# Patient Record
Sex: Female | Born: 1985 | Race: Black or African American | Hispanic: No | Marital: Single | Smoking: Never smoker
Health system: Southern US, Community
[De-identification: ages and names within clinical notes are randomized; demographics above are authoritative.]

## PROBLEM LIST (undated history)

## (undated) DIAGNOSIS — E119 Type 2 diabetes mellitus without complications: Secondary | ICD-10-CM

## (undated) DIAGNOSIS — G43909 Migraine, unspecified, not intractable, without status migrainosus: Secondary | ICD-10-CM

## (undated) HISTORY — DX: Migraine, unspecified, not intractable, without status migrainosus: G43.909

---
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
## 2008-03-05 ENCOUNTER — Ambulatory Visit: Payer: Self-pay | Admitting: *Deleted

## 2008-03-05 DIAGNOSIS — R030 Elevated blood-pressure reading, without diagnosis of hypertension: Secondary | ICD-10-CM | POA: Insufficient documentation

## 2008-03-05 DIAGNOSIS — L708 Other acne: Secondary | ICD-10-CM | POA: Insufficient documentation

## 2008-03-05 DIAGNOSIS — K219 Gastro-esophageal reflux disease without esophagitis: Secondary | ICD-10-CM

## 2008-03-05 DIAGNOSIS — G43009 Migraine without aura, not intractable, without status migrainosus: Secondary | ICD-10-CM

## 2008-03-05 DIAGNOSIS — R079 Chest pain, unspecified: Secondary | ICD-10-CM

## 2008-03-17 ENCOUNTER — Ambulatory Visit: Payer: Self-pay | Admitting: *Deleted

## 2008-03-17 DIAGNOSIS — J069 Acute upper respiratory infection, unspecified: Secondary | ICD-10-CM | POA: Insufficient documentation

## 2008-03-26 ENCOUNTER — Ambulatory Visit: Payer: Self-pay | Admitting: *Deleted

## 2008-03-26 DIAGNOSIS — E669 Obesity, unspecified: Secondary | ICD-10-CM | POA: Insufficient documentation

## 2008-03-26 LAB — CONVERTED CEMR LAB
AST: 22 units/L (ref 0–37)
Albumin: 4 g/dL (ref 3.5–5.2)
BUN: 9 mg/dL (ref 6–23)
CO2: 30 meq/L (ref 19–32)
Calcium: 9.5 mg/dL (ref 8.4–10.5)
Chloride: 103 meq/L (ref 96–112)
Eosinophils Absolute: 0.1 10*3/uL (ref 0.0–0.7)
Eosinophils Relative: 2 % (ref 0.0–5.0)
GFR calc Af Amer: 114 mL/min
Glucose, Bld: 113 mg/dL — ABNORMAL HIGH (ref 70–99)
HDL: 41.2 mg/dL (ref >39.0)
LDL Cholesterol: 133 mg/dL — ABNORMAL HIGH (ref 0–99)
Lymphocytes Relative: 32.9 % (ref 12.0–46.0)
Monocytes Relative: 6.1 % (ref 3.0–12.0)
Neutrophils Relative %: 58.7 % (ref 43.0–77.0)
Platelets: 304 10*3/uL (ref 150–400)
Potassium: 4.1 meq/L (ref 3.5–5.1)
RBC: 4.34 M/uL (ref 3.87–5.11)
TSH: 1.2 microintl units/mL (ref 0.35–5.50)
Total CHOL/HDL Ratio: 4.8
VLDL: 21 mg/dL (ref 0–40)
WBC: 6.3 10*3/uL (ref 4.5–10.5)

## 2008-03-27 DIAGNOSIS — E739 Lactose intolerance, unspecified: Secondary | ICD-10-CM | POA: Insufficient documentation

## 2008-03-27 DIAGNOSIS — E7800 Pure hypercholesterolemia, unspecified: Secondary | ICD-10-CM

## 2008-03-27 DIAGNOSIS — D649 Anemia, unspecified: Secondary | ICD-10-CM

## 2008-05-07 ENCOUNTER — Ambulatory Visit: Payer: Self-pay | Admitting: *Deleted

## 2009-06-02 ENCOUNTER — Ambulatory Visit: Payer: Self-pay | Admitting: Family

## 2009-06-02 DIAGNOSIS — G4700 Insomnia, unspecified: Secondary | ICD-10-CM | POA: Insufficient documentation

## 2010-04-20 NOTE — Assessment & Plan Note (Signed)
Summary: ? Elevated BP, new to est, Dr. Andrey Campanile Pt.- jr   Vital Signs:  Patient profile:   25 year old female Weight:      231 pounds BMI:     32.33 O2 Sat:      98 % on Room air Temp:     98.5 degrees F oral Pulse rate:   94 / minute Pulse rhythm:   regular Resp:     12 per minute BP sitting:   128 / 70  (right arm) Cuff size:   large  Vitals Entered By: Mervin Kung CMA (June 02, 2009 3:14 PM)  O2 Flow:  Room air CC: room 4  Unable to sleep x 2 nights and now having headache., Headache   Primary Care Provider:  Paulo Fruit MD  CC:  room 4  Unable to sleep x 2 nights and now having headache. and Headache.  History of Present Illness: Dawn Shields is a 25 year old female who presents with c/o difficulty sleeping.  Notes that she had headach yesterday which was localized above the left eye.  HA was throbbing in nature- took excedrin which normally helps her, but this did not work per patient.   HA is still present at this time, but has eased off, feels more like a pressure.  Denies associated photophobia, or phonophobia.  These symptoms were similar to her previous migraines, only this headache was worse.  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    Allergies (verified): No Known Drug Allergies  Physical Exam  General:  Well-developed,well-nourished,in no acute distress; alert,appropriate and cooperative throughout examination Lungs:  Normal respiratory effort, chest expands symmetrically. Lungs are clear to auscultation, no crackles or wheezes. Heart:  Normal rate and regular rhythm. S1 and S2 normal without gallop, murmur, click, rub or other extra sounds. Neurologic:  EOM intact strength normal in all extremities and DTRs symmetrical and normal.   Psych:  Cognition and judgment appear intact. Alert and cooperative with normal attention span and concentration. No apparent delusions, illusions, hallucinations   Impression  & Recommendations:  Problem # 1:  COMMON MIGRAINE (ICD-346.10) Assessment Improved Symptoms improved.  Patient was give script for Imitrex to use as needed.   Her updated medication list for this problem includes:    Excedrin Migraine 250-250-65 Mg Tabs (Aspirin-acetaminophen-caffeine) ..... Over the counter as needed    Imitrex 50 Mg Tabs (Sumatriptan succinate) ..... One tablet by mouth as need for migraine, may repeat in 2 hours as needed  Problem # 2:  INSOMNIA, MILD (ICD-780.52) Assessment: Comment Only Notes that generally when she has difficulty sleeping, she has had success with Excedrin PM.  I recommended that she use Benadryl as needed.    Complete Medication List: 1)  Ery-tab 333 Mg Tbec (Erythromycin base) .... One tablet twice daily 2)  Excedrin Migraine 250-250-65 Mg Tabs (Aspirin-acetaminophen-caffeine) .... Over the counter as needed 3)  Imitrex 50 Mg Tabs (Sumatriptan succinate) .... One tablet by mouth as need for migraine, may repeat in 2 hours as needed 4)  Benadryl 25 Mg Tabs (Diphenhydramine hcl) .... One to two tablets by mouth hs as needed insomnia  Patient Instructions: 1)  Please call if you experience recurrent headaches which are not relieved by imitrex, if severe HA go to ER. 2)  Please arrange a follow up appointment for a complete physical. Prescriptions: IMITREX 50 MG TABS (SUMATRIPTAN SUCCINATE) one tablet by mouth  as need for migraine, may repeat in 2 hours as needed  #6 x 0   Entered and Authorized by:   Lemont Fillers FNP   Signed by:   Lemont Fillers FNP on 06/02/2009   Method used:   Electronically to        [facility] [phone][facility] (retail)       [address] [place], [place]  [address]       Ph: [phone]       Fax: [phone]   RxID:   [id number]   Current Allergies (reviewed today): No known allergies

## 2010-06-01 ENCOUNTER — Ambulatory Visit: Payer: Self-pay | Admitting: Internal Medicine

## 2010-12-02 ENCOUNTER — Emergency Department (HOSPITAL_BASED_OUTPATIENT_CLINIC_OR_DEPARTMENT_OTHER)
Admission: EM | Admit: 2010-12-02 | Discharge: 2010-12-02 | Disposition: A | Discharge status: Home / Self Care | Payer: No Typology Code available for payment source | Attending: Emergency Medicine | Admitting: Emergency Medicine

## 2010-12-02 ENCOUNTER — Encounter: Payer: Self-pay | Admitting: *Deleted

## 2010-12-02 DIAGNOSIS — M545 Low back pain, unspecified: Secondary | ICD-10-CM | POA: Insufficient documentation

## 2010-12-02 DIAGNOSIS — Y92410 Unspecified street and highway as the place of occurrence of the external cause: Secondary | ICD-10-CM | POA: Insufficient documentation

## 2010-12-02 DIAGNOSIS — M791 Myalgia, unspecified site: Secondary | ICD-10-CM

## 2010-12-02 DIAGNOSIS — M542 Cervicalgia: Secondary | ICD-10-CM | POA: Insufficient documentation

## 2010-12-02 MED ORDER — HYDROCODONE-ACETAMINOPHEN 5-325 MG PO TABS: 2.0000 | ORAL_TABLET | ORAL | Status: AC | PRN

## 2010-12-02 MED ORDER — IBUPROFEN 800 MG PO TABS: 800.0000 mg | ORAL_TABLET | Freq: Three times a day (TID) | ORAL | Status: AC
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
## 2010-12-06 NOTE — ED Provider Notes (Signed)
Medical screening examination/treatment/procedure(s) were performed by non-physician practitioner and as supervising physician I was immediately available for consultation/collaboration.   Nat Christen, MD 12/06/10 [phone]

## 2011-12-05 ENCOUNTER — Emergency Department (HOSPITAL_BASED_OUTPATIENT_CLINIC_OR_DEPARTMENT_OTHER)
Admission: EM | Admit: 2011-12-05 | Discharge: 2011-12-05 | Disposition: A | Discharge status: Home / Self Care | Payer: 59 | Attending: Emergency Medicine | Admitting: Emergency Medicine

## 2011-12-05 ENCOUNTER — Encounter (HOSPITAL_BASED_OUTPATIENT_CLINIC_OR_DEPARTMENT_OTHER): Payer: Self-pay | Admitting: *Deleted

## 2011-12-05 DIAGNOSIS — M25569 Pain in unspecified knee: Secondary | ICD-10-CM | POA: Insufficient documentation

## 2011-12-05 DIAGNOSIS — M222X1 Patellofemoral disorders, right knee: Secondary | ICD-10-CM

## 2011-12-05 MED ORDER — HYDROCODONE-ACETAMINOPHEN 5-325 MG PO TABS: 2.0000 | ORAL_TABLET | Freq: Three times a day (TID) | ORAL | Status: DC | PRN

## 2011-12-05 NOTE — ED Notes (Signed)
bilateral leg pain since doing exercise for work x 4 days.

## 2011-12-05 NOTE — ED Provider Notes (Signed)
History   This chart was scribed for Hurman Horn, MD by Sofie Rower. The patient was seen in room MH09/MH09 and the patient's care was started at 9:55PM    CSN: [id number]  Arrival date & time 12/05/11  1943   First MD Initiated Contact with Patient 12/05/11 2155      Chief Complaint  Patient presents with  . Leg Pain    (Consider location/radiation/quality/duration/timing/severity/associated sxs/prior treatment) Patient is a 26 y.o. female presenting with leg pain. The history is provided by the patient. No language interpreter was used.  Leg Pain     Dawn Shields is a 26 y.o. female who presents to the Emergency Department complaining of sudden, progressively worsening, leg pain located bilaterally at both knees onset four days ago. The pt reports she has recently started performing exercises, undergoing physical training for the [employer] department, where she has recently gained employment. Four days ago she began to experience tenderness and pain at both of her knees, which has become painful enough to the point where she can no longer exercise. Modifying factors include certain movements and positions of the knees which intensify the knee pain. She is no pain to her hips thighs calves ankles or feet. She is no weakness or numbness to her legs. She is no swelling or rashes. Her pain is constant but worse with palpation and position changes over knees. She is able to walk unassisted.  The pt denies chest pain, abdominal pain, weakness, numbness, and swelling at the lower extremities.   The pt does not smoke or drink alcohol.   PCP is Dr. Cathey Endow.   History reviewed. No pertinent past medical history.  History reviewed. No pertinent past surgical history.  No family history on file.  History  Substance Use Topics  . Smoking status: Never Smoker   . Smokeless tobacco: Not on file  . Alcohol Use: No    OB History    Grav Para Term Preterm Abortions TAB SAB Ect Mult Living                   Review of Systems  10 Systems reviewed and all are negative for acute change except as noted in the HPI.    Allergies  Review of patient's allergies indicates no known allergies.  Home Medications   Current Outpatient Rx  Name Route Sig Dispense Refill  . HYDROCODONE-ACETAMINOPHEN 5-325 MG PO TABS Oral Take 2 tablets by mouth every 8 (eight) hours as needed for pain. 10 tablet 0    BP 124/77  Pulse 118  Temp 97.7 F (36.5 C) (Oral)  Resp 20  SpO2 100%  Physical Exam  Nursing note and vitals reviewed. Constitutional:       Awake, alert, nontoxic appearance.  HENT:  Head: Atraumatic.  Eyes: Right eye exhibits no discharge. Left eye exhibits no discharge.  Neck: Neck supple.  Pulmonary/Chest: Effort normal. She exhibits no tenderness.  Abdominal: Soft. There is no tenderness. There is no rebound.  Musculoskeletal: She exhibits no tenderness.       Baseline ROM, no obvious new focal weakness. DP pulses intact. CR less than 2 seconds. Negative Lachman's test. Negative McMurry test. No laxity with varus or valgus stress testing. Tenderness to bilateral patellar tendons and bilateral joint regions. Doubt compartment syndrome, doubt rhabdomyolysis.   Neurological:       Mental status and motor strength appears baseline for patient and situation.  Skin: No rash noted.  Psychiatric: She has a normal  mood and affect.    ED Course  Procedures (including critical care time)  DIAGNOSTIC STUDIES: Oxygen Saturation is 100% on room air, normal by my interpretation.    COORDINATION OF CARE:    10:02PM- Rest and follow up with the occupational specialist discussed. Pt agrees to treatment.   Labs Reviewed - No data to display No results found.   1. Patellofemoral syndrome, bilateral       MDM  I personally performed the services described in this documentation, which was scribed in my presence. The recorded information has been reviewed and  considered. Pt stable in ED with no significant deterioration in condition.Patient / Family / Caregiver informed of clinical course, understand medical decision-making process, and agree with plan.   Hurman Horn, MD 12/06/11 [phone]

## 2012-01-12 ENCOUNTER — Encounter: Payer: Self-pay | Admitting: Obstetrics and Gynecology

## 2012-01-17 ENCOUNTER — Encounter: Payer: Self-pay | Admitting: Obstetrics and Gynecology

## 2012-02-20 ENCOUNTER — Encounter: Payer: Self-pay | Admitting: Obstetrics and Gynecology

## 2012-04-20 ENCOUNTER — Ambulatory Visit (INDEPENDENT_AMBULATORY_CARE_PROVIDER_SITE_OTHER): Payer: 59 | Admitting: Family Medicine

## 2012-04-20 VITALS — BP 117/77 | HR 99 | Temp 99.0°F | Resp 16 | Ht 71.0 in | Wt 238.0 lb

## 2012-04-20 DIAGNOSIS — Z3009 Encounter for other general counseling and advice on contraception: Secondary | ICD-10-CM

## 2012-04-20 DIAGNOSIS — G43909 Migraine, unspecified, not intractable, without status migrainosus: Secondary | ICD-10-CM

## 2012-04-20 MED ORDER — TOPIRAMATE 25 MG PO CPSP: 25.0000 mg | ORAL_CAPSULE | Freq: Every day | ORAL | Status: DC

## 2012-04-20 MED ORDER — HYDROCODONE-ACETAMINOPHEN 5-325 MG PO TABS: 1.0000 | ORAL_TABLET | Freq: Four times a day (QID) | ORAL | Status: DC | PRN

## 2012-04-20 NOTE — Patient Instructions (Addendum)
Use the pain medication as needed for your current headache.  If you are not better in the next 24 hours please call or come back in.  We are going to try topamax for headache prevention.  Start with one tablet just at bedtime, and then increase to twice a day after one week. Depending on your tolerance of the medication and your headaches we may increase the dose with time.    Check out this link: http://familydoctor.org/familydoctor/en/prevention-wellness/sex-birth-control/birth-control/birth-control-options.printerview.html  For information about birth control options.  The hormone implant or depo- provera shots might be good options for you.

## 2012-04-20 NOTE — Progress Notes (Signed)
[facility] [address] [place] [address] [phone]  Date:  04/20/2012   Name:  Dawn Shields   DOB:  1985-07-09   MRN:  [id number]  PCP:  Seymour Bars, DO    Chief Complaint: Migraine   History of Present Illness:  Dawn Shields is a 27 y.o. very pleasant female patient who presents with the following:  History of migraine HA for many years. She has noted a HA again since yesterday.  No other signs of illness such as fever or cough.  She did have nausea, but has not vomited.  She needs to lie down to help herself feel better when she has her migraine.  She does not get aura.   Lights and noise make her worse.  She has used excedrin, Customer service manager and aspirin but they have not helped so far. This seems like a typical migraine for her- it does not seem unusual, is not the worst HA of her life. She gets these HA twice a week- every week.  She did better for a long time, but then she started a new job about 9 months ago and this seemed to trigger more frequent HA.    She noted that imitrex did not help her in the past.  She does not have a PCP at this time.   She has not used any other prophylactic medication for HA.    She is otherwise healthy.   She is not on contraception at this time.  She is not yet sexually active, but is interested in starting on contraception.  She is not interested in becoming pregnant any time soon if ever She has had migraine HA since the age of 74   Patient Active Problem List  Diagnosis  . GLUCOSE INTOLERANCE  . HYPERCHOLESTEROLEMIA  . OBESITY  . ANEMIA  . COMMON MIGRAINE  . VIRAL URI  . GERD  . ACNE VULGARIS  . INSOMNIA, MILD  . CHEST PAIN  . ELEVATED BP READING WITHOUT DX HYPERTENSION    Past Medical History  Diagnosis Date  . Migraine     History reviewed. No pertinent past surgical history.  History  Substance Use Topics  . Smoking status: Never Smoker   . Smokeless tobacco: Not on file  . Alcohol Use: No     Family History  Problem Relation Age of Onset  . Diabetes Mother   . Heart disease Mother     No Known Allergies  Medication list has been reviewed and updated.  Current Outpatient Prescriptions on File Prior to Visit  Medication Sig Dispense Refill  . HYDROcodone-acetaminophen (NORCO) 5-325 MG per tablet Take 2 tablets by mouth every 8 (eight) hours as needed for pain.  10 tablet  0    Review of Systems:  As per HPI- otherwise negative.   Physical Examination: Filed Vitals:   04/20/12 0813  BP: 117/77  Pulse: 99  Temp: 99 F (37.2 C)  Resp: 16   Filed Vitals:   04/20/12 0813  Height: 5\' 11"  (1.803 m)  Weight: 238 lb (107.956 kg)   Body mass index is 33.19 kg/(m^2). Ideal Body Weight: Weight in (lb) to have BMI = 25: 178.9   GEN: WDWN, NAD, Non-toxic, A & O x 3- tall build HEENT: Atraumatic, Normocephalic. Neck supple. No masses, No LAD. Bilateral TM wnl, oropharynx normal.  PEERL,EOMI.   Ears and Nose: No external deformity. CV: RRR, No M/G/R. No JVD. No thrill. No extra heart  sounds. PULM: CTA B, no wheezes, crackles, rhonchi. No retractions. No resp. distress. No accessory muscle use. ABD: S, NT, ND. No rebound. No HSM. EXTR: No c/c/e NEURO Normal gait.  PSYCH: Normally interactive. Conversant. Not depressed or anxious appearing.  Calm demeanor.  Complete neuro exam wnl- normal strength, sensation, DTR, RAM and negative romberg  Assessment and Plan: 1. Migraine  HYDROcodone-acetaminophen (NORCO) 5-325 MG per tablet, topiramate (TOPAMAX) 25 MG capsule  2. Contraceptive education  Ambulatory referral to Obstetrics / Gynecology  persistent migraine HA- will start topamax for prophylaxis once acute HA is resolved.  Discussed titration of dose- she will start with 25 qhs and increase to BID over the course of a week.  Then will increase as needed.  If heer current HA does not resolve with use of prn norco she is to let us know  Referral to [facility] for contraception  she is interested in implanon which seems like a good option for her.    Abbe Amsterdam, MD

## 2012-05-17 ENCOUNTER — Encounter: Payer: Self-pay | Admitting: Obstetrics and Gynecology

## 2012-05-24 ENCOUNTER — Other Ambulatory Visit: Payer: Self-pay | Admitting: Family Medicine

## 2012-05-27 NOTE — Telephone Encounter (Signed)
Per electronic record, this patient's PCP is Seymour Bars, DO.  If she needs RF of this medication, she needs re-evaluation with her PCP. If we are her PCP, forward request to Dr. Patsy Lager, who saw her last and prescribed this medication.

## 2012-05-28 ENCOUNTER — Telehealth: Payer: Self-pay

## 2012-05-28 NOTE — Telephone Encounter (Signed)
Pharmacy called to check status of this RF request. I asked if Dr Seymour Bars is still writing Rxs for pt and was advised that the only prescribers they have on file for pt are our providers. They do not show any Rxs being written by Dr Cathey Endow.   Dr Patsy Lager, do you want to RF pt's hydrocodone 5/325?

## 2012-05-28 NOTE — Telephone Encounter (Signed)
Pt is returning call from dr copland

## 2012-05-28 NOTE — Telephone Encounter (Signed)
Called her back- had to [facility].  I will give her a few of her norco pills, but the plan was to use topamax as needed to help resolve her HA.  If she is having a very bad or unusual HA please come for follow- up right away.  Otherwise please call or email me with an update as to her HAs

## 2012-05-29 NOTE — Telephone Encounter (Signed)
I think she was letting patient know the hydrocodone Rx was sent in for her. How is her headache? She states she had a bad week last week. Headaches are still same migranes not better with Topamax for prevention.

## 2012-05-30 NOTE — Telephone Encounter (Signed)
Melanie called and LMOM- please call us back so we can talk about this more.

## 2012-05-30 NOTE — Telephone Encounter (Signed)
Called to discuss with her- no answer so LMOM, will try her again tomorrow

## 2012-05-31 NOTE — Telephone Encounter (Signed)
Called her- still no answer.  LMOM- asked her to please call us back or email with an update or come in for a recheck of her HA.

## 2012-06-15 ENCOUNTER — Telehealth: Payer: Self-pay

## 2012-06-15 NOTE — Telephone Encounter (Signed)
PT STATES SHE HAD DEVELOPED A VAGINAL INFECTION FROM THE MEDICINE SHE WAS TAKEN AND WOULD LIKE TO HAVE A DIFLUCAN CALLED IN FOR HER PLEASE CALL PT AT [phone]    [facility] [address]

## 2012-06-15 NOTE — Telephone Encounter (Signed)
What?

## 2012-06-15 NOTE — Telephone Encounter (Signed)
Patient states she is having vaginal discomfort. I have advised her to come to clinic for this would need to check for bacteria vs yeast/.

## 2012-11-20 ENCOUNTER — Other Ambulatory Visit: Payer: Self-pay | Admitting: Family Medicine

## 2012-11-20 NOTE — Telephone Encounter (Signed)
Will give 10 more but OV needed.  Called and [facility] for pt with this info.  If her HA are worse or different please let me know right away.  otherwise please see me prior to further refill

## 2015-05-01 ENCOUNTER — Encounter: Payer: Self-pay | Admitting: Nurse Practitioner

## 2016-07-11 ENCOUNTER — Ambulatory Visit: Payer: Self-pay | Admitting: Physician Assistant

## 2017-04-24 ENCOUNTER — Emergency Department (HOSPITAL_BASED_OUTPATIENT_CLINIC_OR_DEPARTMENT_OTHER): Payer: No Typology Code available for payment source

## 2017-04-24 ENCOUNTER — Encounter (HOSPITAL_BASED_OUTPATIENT_CLINIC_OR_DEPARTMENT_OTHER): Payer: Self-pay | Admitting: Emergency Medicine

## 2017-04-24 ENCOUNTER — Emergency Department (HOSPITAL_BASED_OUTPATIENT_CLINIC_OR_DEPARTMENT_OTHER)
Admission: EM | Admit: 2017-04-24 | Discharge: 2017-04-24 | Disposition: A | Discharge status: Home / Self Care | Payer: No Typology Code available for payment source | Attending: Emergency Medicine | Admitting: Emergency Medicine

## 2017-04-24 ENCOUNTER — Other Ambulatory Visit: Payer: Self-pay

## 2017-04-24 DIAGNOSIS — M542 Cervicalgia: Secondary | ICD-10-CM | POA: Diagnosis not present

## 2017-04-24 DIAGNOSIS — Y92410 Unspecified street and highway as the place of occurrence of the external cause: Secondary | ICD-10-CM | POA: Insufficient documentation

## 2017-04-24 DIAGNOSIS — Y9389 Activity, other specified: Secondary | ICD-10-CM | POA: Diagnosis not present

## 2017-04-24 DIAGNOSIS — M25572 Pain in left ankle and joints of left foot: Secondary | ICD-10-CM | POA: Diagnosis not present

## 2017-04-24 DIAGNOSIS — Y999 Unspecified external cause status: Secondary | ICD-10-CM | POA: Diagnosis not present

## 2017-04-24 DIAGNOSIS — M79672 Pain in left foot: Secondary | ICD-10-CM

## 2017-04-24 DIAGNOSIS — S99912A Unspecified injury of left ankle, initial encounter: Secondary | ICD-10-CM | POA: Diagnosis present
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
## 2017-04-24 NOTE — ED Notes (Signed)
Patient transported to X-ray 
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
## 2017-04-24 NOTE — Discharge Instructions (Signed)
Appointment to establish care with a primary care provider.  You be best for you to be nonweightbearing for the next week until symptoms improved.  Take Tylenol and NSAIDs such as ibuprofen as needed for pain.

## 2018-01-22 ENCOUNTER — Emergency Department (HOSPITAL_BASED_OUTPATIENT_CLINIC_OR_DEPARTMENT_OTHER)
Admission: EM | Admit: 2018-01-22 | Discharge: 2018-01-22 | Disposition: A | Discharge status: Home / Self Care | Payer: BLUE CROSS/BLUE SHIELD | Attending: Emergency Medicine | Admitting: Emergency Medicine

## 2018-01-22 ENCOUNTER — Other Ambulatory Visit: Payer: Self-pay

## 2018-01-22 ENCOUNTER — Emergency Department (HOSPITAL_BASED_OUTPATIENT_CLINIC_OR_DEPARTMENT_OTHER): Payer: BLUE CROSS/BLUE SHIELD

## 2018-01-22 ENCOUNTER — Encounter (HOSPITAL_BASED_OUTPATIENT_CLINIC_OR_DEPARTMENT_OTHER): Payer: Self-pay | Admitting: *Deleted

## 2018-01-22 DIAGNOSIS — Z862 Personal history of diseases of the blood and blood-forming organs and certain disorders involving the immune mechanism: Secondary | ICD-10-CM | POA: Diagnosis not present

## 2018-01-22 DIAGNOSIS — R51 Headache: Secondary | ICD-10-CM | POA: Diagnosis not present

## 2018-01-22 DIAGNOSIS — G43809 Other migraine, not intractable, without status migrainosus: Secondary | ICD-10-CM | POA: Diagnosis not present

## 2018-01-22 LAB — TROPONIN I: Troponin I: <0.03 ng/mL (ref <0.03)

## 2018-01-22 LAB — COMPREHENSIVE METABOLIC PANEL
ALT: 18 U/L (ref 0–44)
ANION GAP: 9 (ref 5–15)
AST: 21 U/L (ref 15–41)
Albumin: 4 g/dL (ref 3.5–5.0)
Alkaline Phosphatase: 64 U/L (ref 38–126)
BUN: 8 mg/dL (ref 6–20)
CHLORIDE: 101 mmol/L (ref 98–111)
CO2: 26 mmol/L (ref 22–32)
Calcium: 9.1 mg/dL (ref 8.9–10.3)
Creatinine, Ser: 0.77 mg/dL (ref 0.44–1.00)
GFR calc non Af Amer: >60 mL/min (ref >60)
Glucose, Bld: 191 mg/dL — ABNORMAL HIGH (ref 70–99)
Potassium: 3.2 mmol/L — ABNORMAL LOW (ref 3.5–5.1)
SODIUM: 136 mmol/L (ref 135–145)
Total Bilirubin: 0.3 mg/dL (ref 0.3–1.2)
Total Protein: 8.5 g/dL — ABNORMAL HIGH (ref 6.5–8.1)

## 2018-01-22 LAB — CBG MONITORING, ED: GLUCOSE-CAPILLARY: 198 mg/dL — AB (ref 70–99)

## 2018-01-22 LAB — DIFFERENTIAL
Abs Immature Granulocytes: 0.02 10*3/uL (ref 0.00–0.07)
BASOS ABS: 0 10*3/uL (ref 0.0–0.1)
BASOS PCT: 0 %
EOS PCT: 2 %
Eosinophils Absolute: 0.2 10*3/uL (ref 0.0–0.5)
Immature Granulocytes: 0 %
Lymphocytes Relative: 36 %
Lymphs Abs: 2.9 10*3/uL (ref 0.7–4.0)
Monocytes Absolute: 0.4 10*3/uL (ref 0.1–1.0)
Monocytes Relative: 5 %
NEUTROS PCT: 57 %
Neutro Abs: 4.5 10*3/uL (ref 1.7–7.7)

## 2018-01-22 LAB — CBC
HCT: 33 % — ABNORMAL LOW (ref 36.0–46.0)
HEMOGLOBIN: 9.6 g/dL — AB (ref 12.0–15.0)
MCH: 21.8 pg — ABNORMAL LOW (ref 26.0–34.0)
MCHC: 29.1 g/dL — AB (ref 30.0–36.0)
MCV: 75 fL — ABNORMAL LOW (ref 80.0–100.0)
Platelets: 434 10*3/uL — ABNORMAL HIGH (ref 150–400)
RBC: 4.4 MIL/uL (ref 3.87–5.11)
RDW: 15.1 % (ref 11.5–15.5)
WBC: 8.1 10*3/uL (ref 4.0–10.5)
nRBC: 0 % (ref 0.0–0.2)

## 2018-01-22 LAB — PROTIME-INR
INR: 0.98
PROTHROMBIN TIME: 12.9 s (ref 11.4–15.2)

## 2018-01-22 LAB — PREGNANCY, URINE: PREG TEST UR: NEGATIVE

## 2018-01-22 LAB — APTT: APTT: 34 s (ref 24–36)

## 2018-01-22 MED ORDER — PROCHLORPERAZINE EDISYLATE 10 MG/2ML IJ SOLN
10.0000 mg | Freq: Once | INTRAMUSCULAR | Status: AC
  Administered 2018-01-22: 10 mg via INTRAVENOUS
  Filled 2018-01-22: qty 2

## 2018-01-22 MED ORDER — DIPHENHYDRAMINE HCL 25 MG PO CAPS
25.0000 mg | ORAL_CAPSULE | Freq: Once | ORAL | Status: AC
  Administered 2018-01-22: 25 mg via ORAL
  Filled 2018-01-22: qty 1

## 2018-01-22 NOTE — ED Notes (Signed)
ED Provider at bedside. 

## 2018-01-22 NOTE — ED Provider Notes (Signed)
[facility] EMERGENCY DEPARTMENT Provider Note   CSN: [id number] Arrival date & time: 01/22/18  1429     History   Chief Complaint Chief Complaint  Patient presents with  . Migraine    HPI Dawn Shields is a 32 y.o. female.  The history is provided by the patient.  Headache   This is a recurrent problem. The current episode started more than 2 days ago. The problem occurs every few hours. The problem has been gradually improving. The headache is associated with emotional stress. The pain is located in the bilateral region. The quality of the pain is described as dull. The pain is at a severity of 5/10. The pain is moderate. The pain does not radiate. Pertinent negatives include no anorexia, no fever, no malaise/fatigue, no chest pressure, no near-syncope, no orthopnea, no palpitations, no syncope, no shortness of breath, no nausea and no vomiting. Associated symptoms comments: Tingling to left hand. She has tried NSAIDs for the symptoms. The treatment provided moderate relief.    Past Medical History:  Diagnosis Date  . Migraine     Patient Active Problem List   Diagnosis Date Noted  . INSOMNIA, MILD 06/02/2009  . GLUCOSE INTOLERANCE 03/27/2008  . HYPERCHOLESTEROLEMIA 03/27/2008  . ANEMIA 03/27/2008  . OBESITY 03/26/2008  . VIRAL URI 03/17/2008  . COMMON MIGRAINE 03/05/2008  . GERD 03/05/2008  . ACNE VULGARIS 03/05/2008  . CHEST PAIN 03/05/2008  . ELEVATED BP READING WITHOUT DX HYPERTENSION 03/05/2008    History reviewed. No pertinent surgical history.   OB History   None      Home Medications    Prior to Admission medications   Medication Sig Start Date End Date Taking? Authorizing Provider  HYDROcodone-acetaminophen (NORCO/VICODIN) 5-325 MG per tablet Take 1 tablet by mouth every 8 (eight) hours as needed for pain. Office visit needed prior to further refills 11/20/12   Copland, Gwenlyn Found, MD  topiramate (TOPAMAX) 25 MG capsule Take 1 capsule (25  mg total) by mouth at bedtime. Increase to one tablet twice a day after one week. 04/20/12   Copland, Gwenlyn Found, MD    Family History Family History  Problem Relation Age of Onset  . Diabetes Mother   . Heart disease Mother     Social History Social History   Tobacco Use  . Smoking status: Never Smoker  . Smokeless tobacco: Never Used  Substance Use Topics  . Alcohol use: No  . Drug use: No     Allergies   Patient has no known allergies.   Review of Systems Review of Systems  Constitutional: Negative for chills, fever and malaise/fatigue.  HENT: Negative for ear pain and sore throat.   Eyes: Negative for pain and visual disturbance.  Respiratory: Negative for cough and shortness of breath.   Cardiovascular: Negative for chest pain, palpitations, orthopnea, syncope and near-syncope.  Gastrointestinal: Negative for abdominal pain, anorexia, nausea and vomiting.  Genitourinary: Negative for dysuria and hematuria.  Musculoskeletal: Negative for arthralgias and back pain.  Skin: Negative for color change and rash.  Neurological: Positive for numbness and headaches. Negative for dizziness, tremors, seizures, syncope, facial asymmetry, speech difficulty, weakness and light-headedness.  All other systems reviewed and are negative.    Physical Exam Updated Vital Signs  ED Triage Vitals  Enc Vitals Group     BP 01/22/18 1432 (!) 150/94     Pulse Rate 01/22/18 1432 87     Resp 01/22/18 1432 14     Temp  01/22/18 1432 98.4 F (36.9 C)     Temp Source 01/22/18 1432 Oral     SpO2 01/22/18 1432 100 %     Weight 01/22/18 1432 284 lb (128.8 kg)     Height 01/22/18 1432 5\' 11"  (1.803 m)     Head Circumference --      Peak Flow --      Pain Score 01/22/18 1436 5     Pain Loc --      Pain Edu? --      Excl. in GC? --     Physical Exam  Constitutional: She is oriented to person, place, and time. She appears well-developed and well-nourished. No distress.  HENT:  Head:  Normocephalic and atraumatic.  Eyes: Pupils are equal, round, and reactive to light. Conjunctivae and EOM are normal.  Neck: Normal range of motion. Neck supple.  Cardiovascular: Normal rate, regular rhythm, normal heart sounds and intact distal pulses.  No murmur heard. Pulmonary/Chest: Effort normal and breath sounds normal. No respiratory distress.  Abdominal: Soft. Bowel sounds are normal. She exhibits no distension. There is no tenderness.  Musculoskeletal: Normal range of motion. She exhibits no edema.  Neurological: She is alert and oriented to person, place, and time. No cranial nerve deficit or sensory deficit. She exhibits normal muscle tone. Coordination normal.  5+/5 strength, normal sensation, no drift, normal gait, 20/20 vision b/l, normal finger to nose finger  Skin: Skin is warm and dry. Capillary refill takes less than 2 seconds.  Psychiatric: She has a normal mood and affect.  Nursing note and vitals reviewed.    ED Treatments / Results  Labs (all labs ordered are listed, but only abnormal results are displayed) Labs Reviewed  CBC - Abnormal; Notable for the following components:      Result Value   Hemoglobin 9.6 (*)    HCT 33.0 (*)    MCV 75.0 (*)    MCH 21.8 (*)    MCHC 29.1 (*)    Platelets 434 (*)    All other components within normal limits  COMPREHENSIVE METABOLIC PANEL - Abnormal; Notable for the following components:   Potassium 3.2 (*)    Glucose, Bld 191 (*)    Total Protein 8.5 (*)    All other components within normal limits  CBG MONITORING, ED - Abnormal; Notable for the following components:   Glucose-Capillary 198 (*)    All other components within normal limits  PROTIME-INR  APTT  DIFFERENTIAL  TROPONIN I  PREGNANCY, URINE    EKG EKG Interpretation  Date/Time:  Monday January 22 2018 14:46:37 EST Ventricular Rate:  92 PR Interval:  162 QRS Duration: 74 QT Interval:  358 QTC Calculation: 442 R Axis:   76 Text Interpretation:   Normal sinus rhythm Nonspecific T wave abnormality Abnormal ECG Confirmed by Virgina Norfolk [phone]) on 01/22/2018 3:04:54 PM   Radiology Ct Head Wo Contrast  Result Date: 01/22/2018 CLINICAL DATA:  Headache today. EXAM: CT HEAD WITHOUT CONTRAST TECHNIQUE: Contiguous axial images were obtained from the base of the skull through the vertex without intravenous contrast. COMPARISON:  None. FINDINGS: Brain: No evidence of acute infarction, hemorrhage, hydrocephalus, extra-axial collection or mass lesion/mass effect. Vascular: No hyperdense vessel or unexpected calcification. Skull: Normal. Negative for fracture or focal lesion. Sinuses/Orbits: Negative. Other: None. IMPRESSION: Normal head CT. Electronically Signed   By: Drusilla Kanner M.D.   On: 01/22/2018 14:59    Procedures Procedures (including critical care time)  Medications Ordered in ED Medications  prochlorperazine (COMPAZINE) injection 10 mg (10 mg Intravenous Given 01/22/18 1518)  diphenhydrAMINE (BENADRYL) capsule 25 mg (25 mg Oral Given 01/22/18 1518)     Initial Impression / Assessment and Plan / ED Course  I have reviewed the triage vital signs and the nursing notes.  Pertinent labs & imaging results that were available during my care of the patient were reviewed by me and considered in my medical decision making (see chart for details).     YARA TOMKINSON is a 32 year old female history of migraines who presents to the ED with headache.  Patient with normal vitals.  No fever.  Patient states that she has had worsening of her migraine headaches over the last several days.  Headache came on gradually several days ago and has improved with Excedrin but is slightly worse today.  She states that she had brief paresthesia feeling in her left hand that has now resolved.  Patient states she had normal sensation but had a tingling feeling in the left hand.  Patient has normal neurological exam.  No stroke risk factors.  Patient is overall  well-appearing.  Has CT head done prior to my evaluation that was unremarkable.  Patient overall with no significant anemia, electrolyte abnormality, kidney injury.  Patient had headache resolved following Compazine and Benadryl.  Patient likely with complex migraine.  No concern for stroke given history and physical.  Given recommendations for PCP follow-up.  She has had migraines since she was 15.  She would likely benefit from Topamax or other preventative medication.  Recommend follow-up with PCP for possible neurology referral.  Understands return precautions.  Discharged from ED in good condition.  This chart was dictated using voice recognition software.  Despite best efforts to proofread,  errors can occur which can change the documentation meaning.   Final Clinical Impressions(s) / ED Diagnoses   Final diagnoses:  Other migraine without status migrainosus, not intractable    ED Discharge Orders    None       Virgina Norfolk, DO 01/22/18 1622

## 2018-01-22 NOTE — ED Triage Notes (Signed)
Migraine today. Hx of same. Numbness and weakness in her hand for an hour. She feels light headed.

## 2018-01-22 NOTE — ED Notes (Signed)
Provider examined pt and suggests migraine. No code stroke

## 2018-01-22 NOTE — ED Notes (Signed)
Patient transported to CT 

## 2018-03-23 DIAGNOSIS — L700 Acne vulgaris: Secondary | ICD-10-CM | POA: Diagnosis not present

## 2018-03-23 DIAGNOSIS — L810 Postinflammatory hyperpigmentation: Secondary | ICD-10-CM | POA: Diagnosis not present

## 2018-07-30 ENCOUNTER — Other Ambulatory Visit: Payer: Self-pay

## 2018-07-30 ENCOUNTER — Encounter (HOSPITAL_COMMUNITY): Payer: Self-pay

## 2018-07-30 ENCOUNTER — Ambulatory Visit (HOSPITAL_COMMUNITY)
Admission: EM | Admit: 2018-07-30 | Discharge: 2018-07-30 | Disposition: A | Discharge status: Home / Self Care | Payer: BLUE CROSS/BLUE SHIELD | Attending: Family Medicine | Admitting: Family Medicine

## 2018-07-30 DIAGNOSIS — N760 Acute vaginitis: Secondary | ICD-10-CM

## 2018-07-30 MED ORDER — FLUCONAZOLE 150 MG PO TABS: 150.0000 mg | ORAL_TABLET | Freq: Once | ORAL | 0 refills | Status: AC

## 2018-07-30 NOTE — ED Provider Notes (Signed)
[facility]    CSN: [id number] Arrival date & time: 07/30/18  1032     History   Chief Complaint Chief Complaint  Patient presents with  . Vaginitis    HPI Dawn Shields is a 33 y.o. female history of hyperlipidemia, GERD, presenting today for evaluation of possible yeast infection.  Patient states that she has had vaginal itching and discharge that have developed over the past 2 days.  She has had external irritation and burning sensation.  Denies dysuria or increased frequency.  Symptoms feel very similar to when she is previously had yeast.  Denies history of bacterial vaginosis.  Denies any new partners or concerns for STDs.  She has had the same partner for the past 10 years.  Last menstrual period was 2 weeks ago.  Is not on any form of birth control.  HPI  Past Medical History:  Diagnosis Date  . Migraine     Patient Active Problem List   Diagnosis Date Noted  . INSOMNIA, MILD 06/02/2009  . GLUCOSE INTOLERANCE 03/27/2008  . HYPERCHOLESTEROLEMIA 03/27/2008  . ANEMIA 03/27/2008  . OBESITY 03/26/2008  . VIRAL URI 03/17/2008  . COMMON MIGRAINE 03/05/2008  . GERD 03/05/2008  . ACNE VULGARIS 03/05/2008  . CHEST PAIN 03/05/2008  . ELEVATED BP READING WITHOUT DX HYPERTENSION 03/05/2008    History reviewed. No pertinent surgical history.  OB History   No obstetric history on file.      Home Medications    Prior to Admission medications   Medication Sig Start Date End Date Taking? Authorizing Provider  fluconazole (DIFLUCAN) 150 MG tablet Take 1 tablet (150 mg total) by mouth once for 1 dose. 07/30/18 07/30/18  Dezra Mandella C, PA-C  HYDROcodone-acetaminophen (NORCO/VICODIN) 5-325 MG per tablet Take 1 tablet by mouth every 8 (eight) hours as needed for pain. Office visit needed prior to further refills 11/20/12   Copland, Gwenlyn FoundJessica C, MD  topiramate (TOPAMAX) 25 MG capsule Take 1 capsule (25 mg total) by mouth at bedtime. Increase to one tablet twice a day  after one week. 04/20/12   Copland, Gwenlyn FoundJessica C, MD    Family History Family History  Problem Relation Age of Onset  . Diabetes Mother   . Heart disease Mother     Social History Social History   Tobacco Use  . Smoking status: Never Smoker  . Smokeless tobacco: Never Used  Substance Use Topics  . Alcohol use: No  . Drug use: No     Allergies   Patient has no known allergies.   Review of Systems Review of Systems  Constitutional: Negative for fever.  Respiratory: Negative for shortness of breath.   Cardiovascular: Negative for chest pain.  Gastrointestinal: Negative for abdominal pain, diarrhea, nausea and vomiting.  Genitourinary: Positive for vaginal discharge. Negative for dysuria, flank pain, genital sores, hematuria, menstrual problem, vaginal bleeding and vaginal pain.  Musculoskeletal: Negative for back pain.  Skin: Negative for rash.  Neurological: Negative for dizziness, light-headedness and headaches.     Physical Exam Triage Vital Signs ED Triage Vitals  Enc Vitals Group     BP 07/30/18 1047 118/78     Pulse Rate 07/30/18 1047 96     Resp 07/30/18 1047 18     Temp 07/30/18 1047 98.3 F (36.8 C)     Temp Source 07/30/18 1047 Oral     SpO2 07/30/18 1047 100 %     Weight 07/30/18 1046 281 lb (127.5 kg)     Height  07/30/18 1046 5\' 11"  (1.803 m)     Head Circumference --      Peak Flow --      Pain Score 07/30/18 1046 8     Pain Loc --      Pain Edu? --      Excl. in GC? --    No data found.  Updated Vital Signs BP 118/78 (BP Location: Left Arm)   Pulse 96   Temp 98.3 F (36.8 C) (Oral)   Resp 18   Ht 5\' 11"  (1.803 m)   Wt 281 lb (127.5 kg)   LMP 07/12/2018   SpO2 100%   BMI 39.19 kg/m   Visual Acuity Right Eye Distance:   Left Eye Distance:   Bilateral Distance:    Right Eye Near:   Left Eye Near:    Bilateral Near:     Physical Exam Vitals signs and nursing note reviewed.  Constitutional:      General: She is not in acute  distress.    Appearance: She is well-developed.  HENT:     Head: Normocephalic and atraumatic.  Eyes:     Conjunctiva/sclera: Conjunctivae normal.  Neck:     Musculoskeletal: Neck supple.  Cardiovascular:     Rate and Rhythm: Normal rate and regular rhythm.     Heart sounds: No murmur.  Pulmonary:     Effort: Pulmonary effort is normal. No respiratory distress.     Breath sounds: Normal breath sounds.  Abdominal:     Palpations: Abdomen is soft.     Tenderness: There is no abdominal tenderness.     Comments: Soft, nondistended, nontender to light deep palpation throughout abdomen  Genitourinary:    Comments: Deferred Skin:    General: Skin is warm and dry.  Neurological:     Mental Status: She is alert.      UC Treatments / Results  Labs (all labs ordered are listed, but only abnormal results are displayed) Labs Reviewed - No data to display  EKG None  Radiology No results found.  Procedures Procedures (including critical care time)  Medications Ordered in UC Medications - No data to display  Initial Impression / Assessment and Plan / UC Course  I have reviewed the triage vital signs and the nursing notes.  Pertinent labs & imaging results that were available during my care of the patient were reviewed by me and considered in my medical decision making (see chart for details).     Patient with vaginitis, will empirically treat for yeast today based on previous experience.  Deferring swab given similar to previous with no history of BV, denies concern for STDs.  Will treat with Diflucan today, advised to follow-up if symptoms not resolving or changing of swab would be warranted at that time.Discussed strict return precautions. Patient verbalized understanding and is agreeable with plan.  Final Clinical Impressions(s) / UC Diagnoses   Final diagnoses:  Vaginitis and vulvovaginitis     Discharge Instructions     Take 1 diflucan today, may repeat in 3 days  if still having symptoms  Follow up if symptoms not improving or changing   ED Prescriptions    Medication Sig Dispense Auth. Provider   fluconazole (DIFLUCAN) 150 MG tablet Take 1 tablet (150 mg total) by mouth once for 1 dose. 2 tablet Damesha Lawler C, PA-C     Controlled Substance Prescriptions [place] Controlled Substance Registry consulted? Not Applicable   Lew Dawes, [place] 07/30/18 1056

## 2018-07-30 NOTE — Discharge Instructions (Signed)
Take 1 diflucan today, may repeat in 3 days if still having symptoms  Follow up if symptoms not improving or changing

## 2018-07-30 NOTE — ED Notes (Signed)
Patient verbalizes understanding of discharge instructions. Opportunity for questioning and answers were provided. Patient discharged from [facility] by provider.  

## 2018-07-30 NOTE — ED Triage Notes (Signed)
Patient presents to Urgent Care with complaints of vaginal itching and discharge since 2 days ago. Patient reports she has had a yeast infection in the past and knows that is what this is this time too, pt requesting diflucan.

## 2018-07-31 ENCOUNTER — Ambulatory Visit: Payer: Self-pay | Admitting: Family Medicine

## 2018-08-15 ENCOUNTER — Encounter (HOSPITAL_COMMUNITY): Payer: Self-pay

## 2018-08-15 ENCOUNTER — Other Ambulatory Visit: Payer: Self-pay

## 2018-08-15 ENCOUNTER — Ambulatory Visit (HOSPITAL_COMMUNITY)
Admission: EM | Admit: 2018-08-15 | Discharge: 2018-08-15 | Disposition: A | Discharge status: Home / Self Care | Payer: BLUE CROSS/BLUE SHIELD | Attending: Family Medicine | Admitting: Family Medicine

## 2018-08-15 DIAGNOSIS — E1165 Type 2 diabetes mellitus with hyperglycemia: Secondary | ICD-10-CM | POA: Diagnosis not present

## 2018-08-15 DIAGNOSIS — R350 Frequency of micturition: Secondary | ICD-10-CM | POA: Diagnosis not present

## 2018-08-15 LAB — GLUCOSE, CAPILLARY: Glucose-Capillary: 362 mg/dL — ABNORMAL HIGH (ref 70–99)

## 2018-08-15 LAB — POCT URINALYSIS DIP (DEVICE)
Bilirubin Urine: NEGATIVE
Glucose, UA: 500 mg/dL — AB
Hgb urine dipstick: NEGATIVE
Ketones, ur: NEGATIVE mg/dL
Leukocytes,Ua: NEGATIVE
Nitrite: NEGATIVE
Protein, ur: NEGATIVE mg/dL
Specific Gravity, Urine: 1.015 (ref 1.005–1.030)
Urobilinogen, UA: 0.2 mg/dL (ref 0.0–1.0)
pH: 5.5 (ref 5.0–8.0)

## 2018-08-15 MED ORDER — METFORMIN HCL 500 MG PO TABS: 500.0000 mg | ORAL_TABLET | Freq: Two times a day (BID) | ORAL | 1 refills | Status: DC

## 2018-08-15 NOTE — ED Triage Notes (Signed)
Pt state she thinks she has a UTI x 2 days. Pt states she has pressure when voiding and she's voiding frequently.

## 2018-08-17 ENCOUNTER — Telehealth: Payer: Self-pay | Admitting: General Practice

## 2018-08-17 ENCOUNTER — Encounter: Payer: Self-pay | Admitting: Family Medicine

## 2018-08-17 ENCOUNTER — Ambulatory Visit: Payer: BLUE CROSS/BLUE SHIELD | Admitting: Family Medicine

## 2018-08-17 ENCOUNTER — Other Ambulatory Visit: Payer: Self-pay

## 2018-08-17 ENCOUNTER — Telehealth: Payer: Self-pay | Admitting: Family Medicine

## 2018-08-17 ENCOUNTER — Other Ambulatory Visit (HOSPITAL_COMMUNITY)
Admission: RE | Admit: 2018-08-17 | Discharge: 2018-08-17 | Disposition: A | Discharge status: Home / Self Care | Payer: BLUE CROSS/BLUE SHIELD | Source: Ambulatory Visit | Attending: Family Medicine | Admitting: Family Medicine

## 2018-08-17 VITALS — BP 128/70 | HR 108 | Ht 71.0 in | Wt 271.2 lb

## 2018-08-17 DIAGNOSIS — R000 Tachycardia, unspecified: Secondary | ICD-10-CM | POA: Diagnosis not present

## 2018-08-17 DIAGNOSIS — B3731 Acute candidiasis of vulva and vagina: Secondary | ICD-10-CM | POA: Insufficient documentation

## 2018-08-17 DIAGNOSIS — E7800 Pure hypercholesterolemia, unspecified: Secondary | ICD-10-CM | POA: Insufficient documentation

## 2018-08-17 DIAGNOSIS — Z8669 Personal history of other diseases of the nervous system and sense organs: Secondary | ICD-10-CM | POA: Diagnosis not present

## 2018-08-17 DIAGNOSIS — B373 Candidiasis of vulva and vagina: Secondary | ICD-10-CM | POA: Insufficient documentation

## 2018-08-17 DIAGNOSIS — D649 Anemia, unspecified: Secondary | ICD-10-CM | POA: Diagnosis not present

## 2018-08-17 DIAGNOSIS — E0865 Diabetes mellitus due to underlying condition with hyperglycemia: Secondary | ICD-10-CM

## 2018-08-17 DIAGNOSIS — E739 Lactose intolerance, unspecified: Secondary | ICD-10-CM | POA: Diagnosis not present

## 2018-08-17 DIAGNOSIS — F4321 Adjustment disorder with depressed mood: Secondary | ICD-10-CM | POA: Insufficient documentation

## 2018-08-17 MED ORDER — FLUCONAZOLE 150 MG PO TABS: 150.0000 mg | ORAL_TABLET | Freq: Once | ORAL | 0 refills | Status: AC

## 2018-08-17 NOTE — Telephone Encounter (Signed)
Patient has been prescreened for COVID and cleared to come into office.

## 2018-08-17 NOTE — Telephone Encounter (Signed)
Ok with me 

## 2018-08-17 NOTE — Telephone Encounter (Signed)
Patient established with Dawn Shields

## 2018-08-17 NOTE — Telephone Encounter (Signed)
Would like to become a new patient, her father is a patient of yours, she has a new Dx of diabetes. Please advise

## 2018-08-17 NOTE — Progress Notes (Addendum)
Established Patient Office Visit  Subjective:  Patient ID: Dawn Shields, female    DOB: [birth year]  Age: 33 y.o. MRN: [id number]  CC:  Chief Complaint  Patient presents with  . Establish Care    HPI Dawn Shields presents for follow-up of elevated blood sugar as seen at her urgent care visit earlier this week.  She has no personal history of diabetes.  She has never been pregnant.  She denies ongoing weight loss frequency of urination or changes in her vision.  Mom has a history of well-controlled diabetes with Glucophage.  Patient was started on Glucophage 500 twice daily.  She is having no issues taking this medicine but she just started it a few days ago.  She had complained of vaginal is itching without discharge or dysuria.  She is not sexually active.  Currently lives alone.  She works in [employer].  Past medical history of migraines since age 43.  These are currently treated with as needed Aleve.  At this time her headaches treatment.  She does describe a pounding unilateral headache relieved with rest darkened environment.  She had taken Topamax in the past but is no longer requiring that medicine.  She has no history of palpitations.  She does not drink alcohol, smoke cigarettes or use illicit drugs.  Denies chest pain months of breath diaphoresis nausea or vomiting or recent caffeine intake.  Special note, her younger sister committed suicide back on March 16. Patient says that she has never had a pap smear.  Chart review reveals microcytic anemia in 11/20.   Past Medical History:  Diagnosis Date  . Migraine     History reviewed. No pertinent surgical history.  Family History  Problem Relation Age of Onset  . Diabetes Mother   . Heart disease Mother     Social History   Socioeconomic History  . Marital status: Single    Spouse name: Not on file  . Number of children: Not on file  . Years of education: Not on file  . Highest education level: Not on file   Occupational History  . Not on file  Social Needs  . Financial resource strain: Not on file  . Food insecurity    Worry: Not on file    Inability: Not on file  . Transportation needs    Medical: Not on file    Non-medical: Not on file  Tobacco Use  . Smoking status: Never Smoker  . Smokeless tobacco: Never Used  Substance and Sexual Activity  . Alcohol use: No  . Drug use: No  . Sexual activity: Not on file  Lifestyle  . Physical activity    Days per week: Not on file    Minutes per session: Not on file  . Stress: Not on file  Relationships  . Social Musician on phone: Not on file    Gets together: Not on file    Attends religious service: Not on file    Active member of club or organization: Not on file    Attends meetings of clubs or organizations: Not on file    Relationship status: Not on file  . Intimate partner violence    Fear of current or ex partner: Not on file    Emotionally abused: Not on file    Physically abused: Not on file    Forced sexual activity: Not on file  Other Topics Concern  . Not on file  Social History  Narrative  . Not on file    Outpatient Medications Prior to Visit  Medication Sig Dispense Refill  . metFORMIN (GLUCOPHAGE) 500 MG tablet Take 1 tablet (500 mg total) by mouth 2 (two) times daily with a meal. 60 tablet 1  . HYDROcodone-acetaminophen (NORCO/VICODIN) 5-325 MG per tablet Take 1 tablet by mouth every 8 (eight) hours as needed for pain. Office visit needed prior to further refills 10 tablet 0  . topiramate (TOPAMAX) 25 MG capsule Take 1 capsule (25 mg total) by mouth at bedtime. Increase to one tablet twice a day after one week. 90 capsule 3   No facility-administered medications prior to visit.     Allergies  Allergen Reactions  . Erythromycin Rash    ROS Review of Systems  Constitutional: Negative for chills, diaphoresis, fatigue, fever and unexpected weight change.  HENT: Negative.   Eyes: Negative for  photophobia and visual disturbance.  Respiratory: Negative.  Negative for chest tightness, shortness of breath and wheezing.   Cardiovascular: Negative for chest pain, palpitations and leg swelling.  Gastrointestinal: Negative.  Negative for nausea and vomiting.  Endocrine: Negative for polyphagia and polyuria.  Genitourinary: Negative for dysuria, frequency, vaginal discharge and vaginal pain.  Musculoskeletal: Negative for gait problem and joint swelling.  Skin: Negative for pallor.  Allergic/Immunologic: Negative for immunocompromised state.  Neurological: Negative for light-headedness and numbness.  Hematological: Does not bruise/bleed easily.  Psychiatric/Behavioral: Negative.       Objective:    Physical Exam  BP 128/70   Pulse (!) 108   Ht 5\' 11"  (1.803 m)   Wt 271 lb 4 oz (123 kg)   LMP 07/25/2018   SpO2 99%   BMI 37.83 kg/m  Wt Readings from Last 3 Encounters:  08/17/18 271 lb 4 oz (123 kg)  08/15/18 281 lb (127.5 kg)  07/30/18 281 lb (127.5 kg)   BP Readings from Last 3 Encounters:  08/17/18 128/70  08/15/18 131/90  07/30/18 118/78   Guideline developer:  UpToDate (see UpToDate for funding source) Date Released: June 2014  Health Maintenance Due  Topic Date Due  . PNEUMOCOCCAL POLYSACCHARIDE VACCINE AGE 5-64 HIGH RISK  06/10/1986  . FOOT EXAM  06/10/1994  . OPHTHALMOLOGY EXAM  06/10/1994  . URINE MICROALBUMIN  06/10/1994  . HIV Screening  06/10/1999  . TETANUS/TDAP  06/10/2003  . PAP SMEAR-Modifier  06/09/2005    There are no preventive care reminders to display for this patient.  Lab Results  Component Value Date   TSH 0.92 08/17/2018   Lab Results  Component Value Date   WBC 6.8 08/17/2018   HGB 9.4 (L) 08/17/2018   HCT 32.2 (L) 08/17/2018   MCV 69.2 (L) 08/17/2018   PLT 482 (H) 08/17/2018   Lab Results  Component Value Date   NA 132 (L) 08/17/2018   K 4.5 08/17/2018   CO2 23 08/17/2018   GLUCOSE 437 (H) 08/17/2018   BUN 7 08/17/2018    CREATININE 0.67 08/17/2018   BILITOT 0.4 08/17/2018   ALKPHOS 64 01/22/2018   AST 19 08/17/2018   ALT 22 08/17/2018   PROT 7.5 08/17/2018   ALBUMIN 4.0 01/22/2018   CALCIUM 9.6 08/17/2018   ANIONGAP 9 01/22/2018   Lab Results  Component Value Date   CHOL 176 08/17/2018   Lab Results  Component Value Date   HDL 36 (L) 08/17/2018   Lab Results  Component Value Date   LDLCALC 106 (H) 08/17/2018   Lab Results  Component Value Date  TRIG 225 (H) 08/17/2018   Lab Results  Component Value Date   CHOLHDL 4.9 08/17/2018   Lab Results  Component Value Date   HGBA1C 13.4 (H) 08/17/2018      Assessment & Plan:   Problem List Items Addressed This Visit      Digestive   GLUCOSE INTOLERANCE - Primary   Relevant Orders   CBC (Completed)   Comprehensive metabolic panel (Completed)   Hemoglobin A1c (Completed)   Urinalysis, Routine w reflex microscopic (Completed)     Genitourinary   Yeast vaginitis   Relevant Orders   CBC (Completed)   Urine cytology ancillary only (Completed)     Other   ANEMIA   Relevant Medications   ferrous sulfate 325 (65 FE) MG tablet   Other Relevant Orders   Iron, TIBC and Ferritin Panel (Completed)   History of migraine   Elevated LDL cholesterol level   Relevant Orders   Comprehensive metabolic panel (Completed)   LDL cholesterol, direct (Completed)   Lipid panel (Completed)   Elevated pulse rate   Relevant Orders   TSH (Completed)   Grieving    Other Visit Diagnoses    Diabetes mellitus due to underlying condition, uncontrolled, with hyperglycemia (HCC)       Relevant Medications   insulin detemir (LEVEMIR) 100 unit/ml SOLN   Other Relevant Orders   Ambulatory referral to diabetic education   Ambulatory referral to Endocrinology      Meds ordered this encounter  Medications  . fluconazole (DIFLUCAN) 150 MG tablet    Sig: Take 1 tablet (150 mg total) by mouth once for 1 dose. Repeat once in one week.    Dispense:  3  tablet    Refill:  0  . DISCONTD: Insulin Glargine (BASAGLAR KWIKPEN) 100 UNIT/ML SOPN    Sig: Inject 0.15 mLs (15 Units total) into the skin daily.    Dispense:  9 mL    Refill:  2    Please instruct.  . ferrous sulfate 325 (65 FE) MG tablet    Sig: Take 1 tablet (325 mg total) by mouth daily with breakfast.    Dispense:  90 tablet    Refill:  3  . DISCONTD: insulin detemir (LEVEMIR) 100 unit/ml SOLN    Sig: Inject 0.15 mLs (15 Units total) into the skin at bedtime. May adjust upward by 4Units EVERY 3 DAYS until fasting sugars are < 140 but >90    Dispense:  12 mL    Refill:  2  . insulin detemir (LEVEMIR) 100 unit/ml SOLN    Sig: Inject 0.15 mLs (15 Units total) into the skin at bedtime. May adjust upward by 4Units EVERY 3 DAYS until fasting sugars are < 140 but >90    Dispense:  12 mL    Refill:  2    Follow-up: Return in about 2 weeks (around 08/31/2018).   Patient will continue Glucophage for now.  Discussed side effects with that medication.  6/12: Patient requests referral to Dr. Everardo All.  She is using the Levemir.  Sugars are still running in the greater than 200 range.  Instructed her to increase the Levemir by 4 units every 3 to 4 days until her fasting sugars are in the less than 140 but greater than 90 range.

## 2018-08-17 NOTE — Patient Instructions (Signed)
Diabetes Basics    Diabetes (diabetes mellitus) is a long-term (chronic) disease. It occurs when the body does not properly use sugar (glucose) that is released from food after you eat.  Diabetes may be caused by one or both of these problems:  · Your pancreas does not make enough of a hormone called insulin.  · Your body does not react in a normal way to insulin that it makes.  Insulin lets sugars (glucose) go into cells in your body. This gives you energy. If you have diabetes, sugars cannot get into cells. This causes high blood sugar (hyperglycemia).  Follow these instructions at home:  How is diabetes treated?  You may need to take insulin or other diabetes medicines daily to keep your blood sugar in balance. Take your diabetes medicines every day as told by your doctor. List your diabetes medicines here:  Diabetes medicines  · Name of medicine: ______________________________  ? Amount (dose): _______________ Time (a.m./p.m.): _______________ Notes: ___________________________________  · Name of medicine: ______________________________  ? Amount (dose): _______________ Time (a.m./p.m.): _______________ Notes: ___________________________________  · Name of medicine: ______________________________  ? Amount (dose): _______________ Time (a.m./p.m.): _______________ Notes: ___________________________________  If you use insulin, you will learn how to give yourself insulin by injection. You may need to adjust the amount based on the food that you eat. List the types of insulin you use here:  Insulin  · Insulin type: ______________________________  ? Amount (dose): _______________ Time (a.m./p.m.): _______________ Notes: ___________________________________  · Insulin type: ______________________________  ? Amount (dose): _______________ Time (a.m./p.m.): _______________ Notes: ___________________________________  · Insulin type: ______________________________  ? Amount (dose): _______________ Time (a.m./p.m.):  _______________ Notes: ___________________________________  · Insulin type: ______________________________  ? Amount (dose): _______________ Time (a.m./p.m.): _______________ Notes: ___________________________________  · Insulin type: ______________________________  ? Amount (dose): _______________ Time (a.m./p.m.): _______________ Notes: ___________________________________  How do I manage my blood sugar?    Check your blood sugar levels using a blood glucose monitor as directed by your doctor.  Your doctor will set treatment goals for you. Generally, you should have these blood sugar levels:  · Before meals (preprandial): 80-130 mg/dL (4.4-7.2 mmol/L).  · After meals (postprandial): below 180 mg/dL (10 mmol/L).  · A1c level: less than 7%.  Write down the times that you will check your blood sugar levels:  Blood sugar checks  · Time: _______________ Notes: ___________________________________  · Time: _______________ Notes: ___________________________________  · Time: _______________ Notes: ___________________________________  · Time: _______________ Notes: ___________________________________  · Time: _______________ Notes: ___________________________________  · Time: _______________ Notes: ___________________________________    What do I need to know about low blood sugar?  Low blood sugar is called hypoglycemia. This is when blood sugar is at or below 70 mg/dL (3.9 mmol/L). Symptoms may include:  · Feeling:  ? Hungry.  ? Worried or nervous (anxious).  ? Sweaty and clammy.  ? Confused.  ? Dizzy.  ? Sleepy.  ? Sick to your stomach (nauseous).  · Having:  ? A fast heartbeat.  ? A headache.  ? A change in your vision.  ? Tingling or no feeling (numbness) around the mouth, lips, or tongue.  ? Jerky movements that you cannot control (seizure).  · Having trouble with:  ? Moving (coordination).  ? Sleeping.  ? Passing out (fainting).  ? Getting upset easily (irritability).  Treating low blood sugar  To treat low blood  sugar, eat or drink something sugary right away. If you can think clearly and swallow safely, follow the 15:15   rule:  · Take 15 grams of a fast-acting carb (carbohydrate). Talk with your doctor about how much you should take.  · Some fast-acting carbs are:  ? Sugar tablets (glucose pills). Take 3-4 glucose pills.  ? 6-8 pieces of hard candy.  ? 4-6 oz (120-150 mL) of fruit juice.  ? 4-6 oz (120-150 mL) of regular (not diet) soda.  ? 1 Tbsp (15 mL) honey or sugar.  · Check your blood sugar 15 minutes after you take the carb.  · If your blood sugar is still at or below 70 mg/dL (3.9 mmol/L), take 15 grams of a carb again.  · If your blood sugar does not go above 70 mg/dL (3.9 mmol/L) after 3 tries, get help right away.  · After your blood sugar goes back to normal, eat a meal or a snack within 1 hour.  Treating very low blood sugar  If your blood sugar is at or below 54 mg/dL (3 mmol/L), you have very low blood sugar (severe hypoglycemia). This is an emergency. Do not wait to see if the symptoms will go away. Get medical help right away. Call your local emergency services (911 in the [place] Do not drive yourself to the hospital.  Questions to ask your health care provider  · Do I need to meet with a diabetes educator?  · What equipment will I need to care for myself at home?  · What diabetes medicines do I need? When should I take them?  · How often do I need to check my blood sugar?  · What number can I call if I have questions?  · When is my next doctor's visit?  · Where can I find a support group for people with diabetes?  Where to find more information  · American Diabetes Association: www.diabetes.org  · American Association of Diabetes Educators: www.diabeteseducator.org/patient-resources  Contact a doctor if:  · Your blood sugar is at or above 240 mg/dL (13.3 mmol/L) for 2 days in a row.  · You have been sick or have had a fever for 2 days or more, and you are not getting better.  · You have any of these  problems for more than 6 hours:  ? You cannot eat or drink.  ? You feel sick to your stomach (nauseous).  ? You throw up (vomit).  ? You have watery poop (diarrhea).  Get help right away if:  · Your blood sugar is lower than 54 mg/dL (3 mmol/L).  · You get confused.  · You have trouble:  ? Thinking clearly.  ? Breathing.  Summary  · Diabetes (diabetes mellitus) is a long-term (chronic) disease. It occurs when the body does not properly use sugar (glucose) that is released from food after digestion.  · Take insulin and diabetes medicines as told.  · Check your blood sugar every day, as often as told.  · Keep all follow-up visits as told by your doctor. This is important.  This information is not intended to replace advice given to you by your health care provider. Make sure you discuss any questions you have with your health care provider.  Document Released: 06/09/2017 Document Revised: 08/28/2017 Document Reviewed: 06/09/2017  Elsevier Interactive Patient Education © 2019 [employer]  Diabetes Mellitus and Exercise  Exercising regularly is important for your overall health, especially when you have diabetes (diabetes mellitus). Exercising is not only about losing weight. It has many other health benefits, such as increasing muscle strength and bone density   and reducing body fat and stress. This leads to improved fitness, flexibility, and endurance, all of which result in better overall health.  Exercise has additional benefits for people with diabetes, including:  · Reducing appetite.  · Helping to lower and control blood glucose.  · Lowering blood pressure.  · Helping to control amounts of fatty substances (lipids) in the blood, such as cholesterol and triglycerides.  · Helping the body to respond better to insulin (improving insulin sensitivity).  · Reducing how much insulin the body needs.  · Decreasing the risk for heart disease by:  ? Lowering cholesterol and triglyceride levels.  ? Increasing the levels of  good cholesterol.  ? Lowering blood glucose levels.  What is my activity plan?  Your health care provider or certified diabetes educator can help you make a plan for the type and frequency of exercise (activity plan) that works for you. Make sure that you:  · Do at least 150 minutes of moderate-intensity or vigorous-intensity exercise each week. This could be brisk walking, biking, or water aerobics.  ? Do stretching and strength exercises, such as yoga or weightlifting, at least 2 times a week.  ? Spread out your activity over at least 3 days of the week.  · Get some form of physical activity every day.  ? Do not go more than 2 days in a row without some kind of physical activity.  ? Avoid being inactive for more than 30 minutes at a time. Take frequent breaks to walk or stretch.  · Choose a type of exercise or activity that you enjoy, and set realistic goals.  · Start slowly, and gradually increase the intensity of your exercise over time.  What do I need to know about managing my diabetes?    · Check your blood glucose before and after exercising.  ? If your blood glucose is 240 mg/dL (13.3 mmol/L) or higher before you exercise, check your urine for ketones. If you have ketones in your urine, do not exercise until your blood glucose returns to normal.  ? If your blood glucose is 100 mg/dL (5.6 mmol/L) or lower, eat a snack containing 15-20 grams of carbohydrate. Check your blood glucose 15 minutes after the snack to make sure that your level is above 100 mg/dL (5.6 mmol/L) before you start your exercise.  · Know the symptoms of low blood glucose (hypoglycemia) and how to treat it. Your risk for hypoglycemia increases during and after exercise. Common symptoms of hypoglycemia can include:  ? Hunger.  ? Anxiety.  ? Sweating and feeling clammy.  ? Confusion.  ? Dizziness or feeling light-headed.  ? Increased heart rate or palpitations.  ? Blurry vision.  ? Tingling or numbness around the mouth, lips, or  tongue.  ? Tremors or shakes.  ? Irritability.  · Keep a rapid-acting carbohydrate snack available before, during, and after exercise to help prevent or treat hypoglycemia.  · Avoid injecting insulin into areas of the body that are going to be exercised. For example, avoid injecting insulin into:  ? The arms, when playing tennis.  ? The legs, when jogging.  · Keep records of your exercise habits. Doing this can help you and your health care provider adjust your diabetes management plan as needed. Write down:  ? Food that you eat before and after you exercise.  ? Blood glucose levels before and after you exercise.  ? The type and amount of exercise you have done.  ? When your insulin   is expected to peak, if you use insulin. Avoid exercising at times when your insulin is peaking.  · When you start a new exercise or activity, work with your health care provider to make sure the activity is safe for you, and to adjust your insulin, medicines, or food intake as needed.  · Drink plenty of water while you exercise to prevent dehydration or heat stroke. Drink enough fluid to keep your urine clear or pale yellow.  Summary  · Exercising regularly is important for your overall health, especially when you have diabetes (diabetes mellitus).  · Exercising has many health benefits, such as increasing muscle strength and bone density and reducing body fat and stress.  · Your health care provider or certified diabetes educator can help you make a plan for the type and frequency of exercise (activity plan) that works for you.  · When you start a new exercise or activity, work with your health care provider to make sure the activity is safe for you, and to adjust your insulin, medicines, or food intake as needed.  This information is not intended to replace advice given to you by your health care provider. Make sure you discuss any questions you have with your health care provider.  Document Released: 05/28/2003 Document Revised:  09/15/2016 Document Reviewed: 08/17/2015  Elsevier Interactive Patient Education © 2019 [employer]

## 2018-08-18 LAB — IRON,TIBC AND FERRITIN PANEL
%SAT: 7 % (calc) — ABNORMAL LOW (ref 16–45)
Ferritin: 5 ng/mL — ABNORMAL LOW (ref 16–154)
Iron: 28 ug/dL — ABNORMAL LOW (ref 40–190)
TIBC: 389 mcg/dL (calc) (ref 250–450)

## 2018-08-19 LAB — LDL CHOLESTEROL, DIRECT: Direct LDL: 119 mg/dL — ABNORMAL HIGH (ref <100)

## 2018-08-19 LAB — CBC
HCT: 32.2 % — ABNORMAL LOW (ref 35.0–45.0)
Hemoglobin: 9.4 g/dL — ABNORMAL LOW (ref 11.7–15.5)
MCH: 20.2 pg — ABNORMAL LOW (ref 27.0–33.0)
MCHC: 29.2 g/dL — ABNORMAL LOW (ref 32.0–36.0)
MCV: 69.2 fL — ABNORMAL LOW (ref 80.0–100.0)
MPV: 10.1 fL (ref 7.5–12.5)
Platelets: 482 10*3/uL — ABNORMAL HIGH (ref 140–400)
RBC: 4.65 10*6/uL (ref 3.80–5.10)
RDW: 15 % (ref 11.0–15.0)
WBC: 6.8 10*3/uL (ref 3.8–10.8)

## 2018-08-19 LAB — COMPREHENSIVE METABOLIC PANEL
AG Ratio: 1.2 (calc) (ref 1.0–2.5)
ALT: 22 U/L (ref 6–29)
AST: 19 U/L (ref 10–30)
Albumin: 4.1 g/dL (ref 3.6–5.1)
Alkaline phosphatase (APISO): 80 U/L (ref 31–125)
BUN: 7 mg/dL (ref 7–25)
CO2: 23 mmol/L (ref 20–32)
Calcium: 9.6 mg/dL (ref 8.6–10.2)
Chloride: 98 mmol/L (ref 98–110)
Creat: 0.67 mg/dL (ref 0.50–1.10)
Globulin: 3.4 g/dL (calc) (ref 1.9–3.7)
Glucose, Bld: 437 mg/dL — ABNORMAL HIGH (ref 65–99)
Potassium: 4.5 mmol/L (ref 3.5–5.3)
Sodium: 132 mmol/L — ABNORMAL LOW (ref 135–146)
Total Bilirubin: 0.4 mg/dL (ref 0.2–1.2)
Total Protein: 7.5 g/dL (ref 6.1–8.1)

## 2018-08-19 LAB — LIPID PANEL
Cholesterol: 176 mg/dL (ref <200)
HDL: 36 mg/dL — ABNORMAL LOW (ref >=50)
LDL Cholesterol (Calc): 106 mg/dL (calc) — ABNORMAL HIGH
Non-HDL Cholesterol (Calc): 140 mg/dL (calc) — ABNORMAL HIGH (ref <130)
Total CHOL/HDL Ratio: 4.9 (calc) (ref <5.0)
Triglycerides: 225 mg/dL — ABNORMAL HIGH (ref <150)

## 2018-08-19 LAB — URINALYSIS, ROUTINE W REFLEX MICROSCOPIC
Bilirubin Urine: NEGATIVE
Hgb urine dipstick: NEGATIVE
Leukocytes,Ua: NEGATIVE
Nitrite: NEGATIVE
Protein, ur: NEGATIVE
Specific Gravity, Urine: 1.044 — ABNORMAL HIGH (ref 1.001–1.03)
pH: 6 (ref 5.0–8.0)

## 2018-08-19 LAB — TSH: TSH: 0.92 mIU/L

## 2018-08-19 LAB — HEMOGLOBIN A1C
Hgb A1c MFr Bld: 13.4 % of total Hgb — ABNORMAL HIGH (ref <5.7)
Mean Plasma Glucose: 338 (calc)
eAG (mmol/L): 18.7 (calc)

## 2018-08-20 MED ORDER — FERROUS SULFATE 325 (65 FE) MG PO TABS: 325.0000 mg | ORAL_TABLET | Freq: Every day | ORAL | 3 refills | Status: DC

## 2018-08-20 MED ORDER — BASAGLAR KWIKPEN 100 UNIT/ML ~~LOC~~ SOPN: 15.0000 [IU] | PEN_INJECTOR | Freq: Every day | SUBCUTANEOUS | 2 refills | Status: DC

## 2018-08-20 NOTE — Addendum Note (Signed)
Addended by: Andrez Grime on: 08/20/2018 10:49 AM   Modules accepted: Orders

## 2018-08-20 NOTE — Addendum Note (Signed)
Addended by: Nadene Rubins A on: 08/20/2018 10:08 AM   Modules accepted: Orders

## 2018-08-21 LAB — URINE CYTOLOGY ANCILLARY ONLY
Bacterial vaginitis: NEGATIVE
Candida vaginitis: NEGATIVE

## 2018-08-21 MED ORDER — INSULIN DETEMIR 100 UNIT/ML FLEXPEN: 15.0000 [IU] | Freq: Every day | SUBCUTANEOUS | 2 refills | Status: DC

## 2018-08-21 NOTE — Addendum Note (Signed)
Addended by: Andrez Grime on: 08/21/2018 08:58 AM   Modules accepted: Orders

## 2018-08-21 NOTE — ED Provider Notes (Signed)
[facility] CARE CENTER   [id number] 08/15/18 Arrival Time: 5465  ASSESSMENT & PLAN:  1. Urinary frequency   2. Type 2 diabetes mellitus with hyperglycemia, without long-term current use of insulin (HCC)    New diagnosis. Highly recommend establish care with PCP. Discussed.  Meds ordered this encounter  Medications  . metFORMIN (GLUCOPHAGE) 500 MG tablet    Sig: Take 1 tablet (500 mg total) by mouth 2 (two) times daily with a meal.    Dispense:  60 tablet    Refill:  1    Follow-up Information    [facility].   Specialty:  Urgent Care Why:  One week follow up for blood sugar recheck. Contact information: [address] [place] [address] [phone]         ED if abrupt worsening.  Reviewed expectations re: course of current medical issues. Questions answered. Outlined signs and symptoms indicating need for more acute intervention. Patient verbalized understanding. After Visit Summary given.   SUBJECTIVE: History from: patient. Dawn Shields is a 33 y.o. female who presents with complaint of urinary frequency over the past 2-3 days. Increased thirst. Afebrile. No abdominal pain. No dysuria. No incontinence. Multiple nocturia episodes. Weight stable. Normal PO intake without n/v. No new medications. No specific aggravating or alleviating factors reported. No recent travel. No h/o similar symptoms to the extent she is feeling now. "Occasionally thirsty."  ROS: As per HPI. All other systems negative.    OBJECTIVE:  Vitals:   08/15/18 1944 08/15/18 1945  BP: 131/90   Pulse: 98   Resp: 18   Temp: 98 F (36.7 C)   TempSrc: Oral   SpO2: 98%   Weight:  127.5 kg    General appearance: alert; no distress Eyes: PERRLA; EOMI; conjunctiva normal HENT: normocephalic; atraumatic; TMs normal; nasal mucosa normal; oral mucosa normal Neck: supple  Lungs: clear to auscultation bilaterally; unlabored Heart: regular  rate and rhythm without murmer Abdomen: soft, non-tender; bowel sounds normal; no masses or organomegaly; no guarding or rebound tenderness Back: no CVA tenderness Extremities: no edema; symmetrical with no gross deformities Skin: warm and dry Neurologic: normal gait; normal symmetric reflexes Psychological: alert and cooperative; normal mood and affect  Labs: Results for orders placed or performed during the hospital encounter of 08/15/18  Glucose, capillary  Result Value Ref Range   Glucose-Capillary 362 (H) 70 - 99 mg/dL  POCT urinalysis dip (device)  Result Value Ref Range   Glucose, UA 500 (A) NEGATIVE mg/dL   Bilirubin Urine NEGATIVE NEGATIVE   Ketones, ur NEGATIVE NEGATIVE mg/dL   Specific Gravity, Urine 1.015 1.005 - 1.030   Hgb urine dipstick NEGATIVE NEGATIVE   pH 5.5 5.0 - 8.0   Protein, ur NEGATIVE NEGATIVE mg/dL   Urobilinogen, UA 0.2 0.0 - 1.0 mg/dL   Nitrite NEGATIVE NEGATIVE   Leukocytes,Ua NEGATIVE NEGATIVE   Labs Reviewed  GLUCOSE, CAPILLARY - Abnormal; Notable for the following components:      Result Value   Glucose-Capillary 362 (*)    All other components within normal limits  POCT URINALYSIS DIP (DEVICE) - Abnormal; Notable for the following components:   Glucose, UA 500 (*)    All other components within normal limits  CBG MONITORING, ED    Allergies  Allergen Reactions  . Erythromycin Rash    Past Medical History:  Diagnosis Date  . Migraine    Social History   Socioeconomic History  . Marital status: Single  Spouse name: Not on file  . Number of children: Not on file  . Years of education: Not on file  . Highest education level: Not on file  Occupational History  . Not on file  Social Needs  . Financial resource strain: Not on file  . Food insecurity:    Worry: Not on file    Inability: Not on file  . Transportation needs:    Medical: Not on file    Non-medical: Not on file  Tobacco Use  . Smoking status: Never Smoker  .  Smokeless tobacco: Never Used  Substance and Sexual Activity  . Alcohol use: No  . Drug use: No  . Sexual activity: Not on file  Lifestyle  . Physical activity:    Days per week: Not on file    Minutes per session: Not on file  . Stress: Not on file  Relationships  . Social connections:    Talks on phone: Not on file    Gets together: Not on file    Attends religious service: Not on file    Active member of club or organization: Not on file    Attends meetings of clubs or organizations: Not on file    Relationship status: Not on file  . Intimate partner violence:    Fear of current or ex partner: Not on file    Emotionally abused: Not on file    Physically abused: Not on file    Forced sexual activity: Not on file  Other Topics Concern  . Not on file  Social History Narrative  . Not on file   Family History  Problem Relation Age of Onset  . Diabetes Mother   . Heart disease Mother    History reviewed. No pertinent surgical history.   Mardella LaymanHagler, Maxemiliano Riel, MD 08/21/18 1028

## 2018-08-22 ENCOUNTER — Other Ambulatory Visit: Payer: Self-pay | Admitting: Family Medicine

## 2018-08-22 MED ORDER — PEN NEEDLES 32G X 4 MM MISC: 1.0000 | Freq: Every day | 2 refills | Status: DC

## 2018-08-22 MED ORDER — INSULIN SYRINGES (DISPOSABLE) U-100 1 ML MISC: 2 refills | Status: DC

## 2018-08-22 NOTE — Telephone Encounter (Signed)
Copied from CRM [id number]. Topic: Quick Communication - Rx Refill/Question >> Aug 22, 2018 11:08 AM Fanny Bien wrote: Medication: needs needles for insulin detemir (LEVEMIR) 100 unit/ml SOLN [[id number]] sent to pharmacy. Please advise

## 2018-08-22 NOTE — Telephone Encounter (Signed)
Rx's sent to pharmacy.  

## 2018-08-23 ENCOUNTER — Other Ambulatory Visit: Payer: Self-pay

## 2018-08-23 MED ORDER — INSULIN PEN NEEDLE 31G X 5 MM MISC: 3 refills | Status: DC

## 2018-08-29 ENCOUNTER — Encounter: Payer: Self-pay | Admitting: Registered"

## 2018-08-29 ENCOUNTER — Other Ambulatory Visit: Payer: Self-pay

## 2018-08-29 ENCOUNTER — Encounter: Payer: BC Managed Care – PPO | Attending: Family Medicine | Admitting: Registered"

## 2018-08-29 DIAGNOSIS — E119 Type 2 diabetes mellitus without complications: Secondary | ICD-10-CM | POA: Diagnosis not present

## 2018-08-29 NOTE — Progress Notes (Addendum)
Diabetes Self-Management Education  Visit Type: First/Initial  Appt. Start Time: 1045 Appt. End Time: 1150  08/29/2018  Dawn Shields, identified by name and date of birth, is a 33 y.o. female with a diagnosis of Diabetes: Type 2.   ASSESSMENT  There were no vitals taken for this visit. There is no height or weight on file to calculate BMI.   Pt states not only is the T2DM diagnosis new (A1c 13.4%), but also the iron deficiency.  Pt states she has been having yearly wellness exams and her BG has been fine. Pt states she did not get upset when she received the news, has the attitude to accept it and educate herself on how to manage it.  Investigating iron deficiency: Pt denies menstrual irregularities. Pt report 10 lbs after sister passed in March over about 1 month. only ate enough to address hunger pangs, has been drinking more water. Although appetite has improved, continues to eat less being mindful to eat just until satisfied.Wt loss and loss of appetite likely d/t grief. These could also be signs of Celiac Disease. Briefly discussed with patient and she may want to follow up with her doctor in looking into it. Patient assured me she will not go on a gluten free diet at this point.   Insulin administration: Pt reports sometimes seeing a tiny drop on needle after removing. Pt states her follow-up with MD had been changed since starting the insulin and doesn't know when she is supposed to go in again. Because her numbers are still getting in high 200s, I recommended she get back with her doctor to see if he wants to increased medications.  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 Pt states she is not planning to continue long term. Patient lives alone but is very close to her mother who prepares food for her. Pt states her mother is able to control her DM with lifestyle.  Pt states she was diagnosed with T2DM after a 2-week  treatment for yeast infection didn't clear up issue so she went back health care provider who then checked her blood sugar.   Pt started increasing her physical activity after diagnosis as well. Lives in a large apartment complex which takes about 30 min to walk the perimeter. Also likes to stay active with her young nieces and nephews. 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  New dx - Pt states her MD told her we would provide meter. Provided [employer] Ver[employer] Flex Lot # [id number] x 12/19/2018  Diabetes Self-Management Education - 08/29/18 1049      Visit Information   Visit Type  First/Initial      Initial Visit   Diabetes Type  Type 2    Are you currently following a meal plan?  No    Are you taking your medications as prescribed?  Yes   Lantus 15 u, metformin 1000 mg   Date Diagnosed  May 2020      Health Coping   How would you rate your overall health?  Good      Psychosocial Assessment   Patient Belief/Attitude about Diabetes  Motivated to manage diabetes    How often do you need to have someone help you when you read instructions, pamphlets, or other written materials from your doctor or pharmacy?  1 - Never    What is the last grade level you completed in school?  masters  Complications   Last HgB A1C per patient/outside source  13.4 %    How often do you check your blood sugar?  1-2 times/day    Fasting Blood glucose range (mg/dL)  >200    Number of hyperglycemic episodes per week  7    Can you tell when your blood sugar is high?  No    Have you had a dilated eye exam in the past 12 months?  Yes    Have you had a dental exam in the past 12 months?  Yes    Are you checking your feet?  Yes    How many days per week are you checking your feet?  7      Dietary Intake   Breakfast  egg, fruit apple or orange, cranberry juice unsweetened, or toast    Snack (morning)  none OR piece of fruit    Lunch  salmon, broccoli OR baked chicken wrap    Dinner   salmon or baked chicken, broccoli, salad    Snack (evening)  orange    Beverage(s)  water, cranberry juice      Exercise   Exercise Type  Light (walking / raking leaves)    How many days per week to you exercise?  5    How many minutes per day do you exercise?  30    Total minutes per week of exercise  150      Patient Education   Previous Diabetes Education  No    Nutrition management   Role of diet in the treatment of diabetes and the relationship between the three main macronutrients and blood glucose level    Physical activity and exercise   Role of exercise on diabetes management, blood pressure control and cardiac health.    Medications  Reviewed patients medication for diabetes, action, purpose, timing of dose and side effects.;Taught/reviewed insulin injection, site rotation, insulin storage and needle disposal.    Monitoring  Identified appropriate SMBG and/or A1C goals.      Individualized Goals (developed by patient)   Nutrition  General guidelines for healthy choices and portions discussed    Physical Activity  Exercise 3-5 times per week    Medications  take my medication as prescribed      Outcomes   Expected Outcomes  Demonstrated interest in learning. Expect positive outcomes    Future DMSE  4-6 wks    Program Status  Not Completed       Individualized Plan for Diabetes Self-Management Training:   Learning Objective:  Patient will have a greater understanding of diabetes self-management. Patient education plan is to attend individual and/or group sessions per assessed needs and concerns.    Patient Instructions  Continue to eat balanced meals and consider adding protein to your snack that include fruit. Continue exercise as tolerated 3-5 times per week, 30-45 min. Consider call insurance to see if they cover the CGM.  Consider talking to your doctor about his plans to increase your insulin and metformin. Consider leaving your insulin needle in for a few seconds  longer to ensure full delivery of medication. Consider keeping sleep schedule the same even on your days off   Expected Outcomes:  Demonstrated interest in learning. Expect positive outcomes  Education material provided: ADA - How to Thrive: A Guide for Your Journey with Diabetes and A1C conversion sheet, Freestyle CGM flyer, Iron deficiency Nutrition Therapy (AND)  If problems or questions, patient to contact team via:  Phone and Email  Future DSME appointment: 4-6 wks

## 2018-08-29 NOTE — Patient Instructions (Addendum)
Continue to eat balanced meals and consider adding protein to your snack that include fruit. Continue exercise as tolerated 3-5 times per week, 30-45 min. Consider call insurance to see if they cover the CGM.  Consider talking to your doctor about his plans to increase your insulin and metformin. Consider leaving your insulin needle in for a few seconds longer to ensure full delivery of medication. Consider keeping sleep schedule the same even on your days off

## 2018-08-31 ENCOUNTER — Ambulatory Visit: Payer: Self-pay

## 2018-08-31 NOTE — Addendum Note (Signed)
Addended by: Jon Billings on: 08/31/2018 10:56 AM   Modules accepted: Orders

## 2018-08-31 NOTE — Telephone Encounter (Signed)
Patient called and says since starting on the Levemir, she's been having blurred vision. She says she wears reading glasses anyway and is having to wear them more with driving and when reading she has to hold whatever she's reading out further, even with the glasses on. She says she has an eye appointment on Monday, because she knows her vision is changing, but she's noticed more blurry since starting the Levemir. She also says that her blood sugars have still been in the 200's fasting. She says she spoke with the pharmacist and was told the instructions are to increase by 4 units every 3 days until the blood sugar is greater than 90 and less then 140; was told if she's uncomfortable with the 4 units, try 2 units and see how she does. She says she took 17 units last night and this morning's blood sugar was 278. Her question to Dr. Ethelene Hal is should she take the additional 4 units tonight and wait 3 days to take 4 units again or should she wait the 3 days to start the 4 units, since she took additional 2 last night? She also asks for a Endocrinologist referral to Dr. Lissa Merlin. I advised I will send this note to Dr. Ethelene Hal and someone will call back with his recommendation on the insulin and referral. She says she needs to schedule her 2 week follow up and is off on 09/07/18, I connected her to [place], [facility] at the office to schedule the appointment.  Reason for Disposition . Caller has NON-URGENT medication question about med that PCP prescribed and triager unable to answer question  Protocols used: MEDICATION QUESTION CALL-A-AH

## 2018-08-31 NOTE — Telephone Encounter (Signed)
D/W patient. Instructed use of levemir and have referred her to Dr. Loanne Drilling.

## 2018-09-03 DIAGNOSIS — H52223 Regular astigmatism, bilateral: Secondary | ICD-10-CM | POA: Diagnosis not present

## 2018-09-03 DIAGNOSIS — H5203 Hypermetropia, bilateral: Secondary | ICD-10-CM | POA: Diagnosis not present

## 2018-09-04 ENCOUNTER — Other Ambulatory Visit: Payer: Self-pay

## 2018-09-04 ENCOUNTER — Encounter (HOSPITAL_BASED_OUTPATIENT_CLINIC_OR_DEPARTMENT_OTHER): Payer: Self-pay | Admitting: Emergency Medicine

## 2018-09-04 ENCOUNTER — Emergency Department (HOSPITAL_BASED_OUTPATIENT_CLINIC_OR_DEPARTMENT_OTHER)
Admission: EM | Admit: 2018-09-04 | Discharge: 2018-09-04 | Disposition: A | Discharge status: Home / Self Care | Payer: BC Managed Care – PPO | Attending: Emergency Medicine | Admitting: Emergency Medicine

## 2018-09-04 DIAGNOSIS — Z794 Long term (current) use of insulin: Secondary | ICD-10-CM | POA: Diagnosis not present

## 2018-09-04 DIAGNOSIS — E1165 Type 2 diabetes mellitus with hyperglycemia: Secondary | ICD-10-CM | POA: Insufficient documentation

## 2018-09-04 DIAGNOSIS — E118 Type 2 diabetes mellitus with unspecified complications: Secondary | ICD-10-CM | POA: Diagnosis not present

## 2018-09-04 DIAGNOSIS — E119 Type 2 diabetes mellitus without complications: Secondary | ICD-10-CM

## 2018-09-04 HISTORY — DX: Type 2 diabetes mellitus without complications: E11.9

## 2018-09-04 LAB — BASIC METABOLIC PANEL
Anion gap: 11 (ref 5–15)
BUN: 13 mg/dL (ref 6–20)
CO2: 24 mmol/L (ref 22–32)
Calcium: 9.1 mg/dL (ref 8.9–10.3)
Chloride: 100 mmol/L (ref 98–111)
Creatinine, Ser: 0.72 mg/dL (ref 0.44–1.00)
GFR calc Af Amer: >60 mL/min (ref >60)
GFR calc non Af Amer: >60 mL/min (ref >60)
Glucose, Bld: 193 mg/dL — ABNORMAL HIGH (ref 70–99)
Potassium: 3.8 mmol/L (ref 3.5–5.1)
Sodium: 135 mmol/L (ref 135–145)

## 2018-09-04 LAB — CBC WITH DIFFERENTIAL/PLATELET
Abs Immature Granulocytes: 0.06 10*3/uL (ref 0.00–0.07)
Basophils Absolute: 0 10*3/uL (ref 0.0–0.1)
Basophils Relative: 0 %
Eosinophils Absolute: 0.2 10*3/uL (ref 0.0–0.5)
Eosinophils Relative: 2 %
HCT: 34.6 % — ABNORMAL LOW (ref 36.0–46.0)
Hemoglobin: 9.5 g/dL — ABNORMAL LOW (ref 12.0–15.0)
Immature Granulocytes: 1 %
Lymphocytes Relative: 37 %
Lymphs Abs: 3.7 10*3/uL (ref 0.7–4.0)
MCH: 20.3 pg — ABNORMAL LOW (ref 26.0–34.0)
MCHC: 27.5 g/dL — ABNORMAL LOW (ref 30.0–36.0)
MCV: 73.8 fL — ABNORMAL LOW (ref 80.0–100.0)
Monocytes Absolute: 0.5 10*3/uL (ref 0.1–1.0)
Monocytes Relative: 5 %
Neutro Abs: 5.6 10*3/uL (ref 1.7–7.7)
Neutrophils Relative %: 55 %
Platelets: 512 10*3/uL — ABNORMAL HIGH (ref 150–400)
RBC: 4.69 MIL/uL (ref 3.87–5.11)
RDW: 17 % — ABNORMAL HIGH (ref 11.5–15.5)
WBC: 10 10*3/uL (ref 4.0–10.5)
nRBC: 0 % (ref 0.0–0.2)

## 2018-09-04 LAB — URINALYSIS, MICROSCOPIC (REFLEX): RBC / HPF: >50 RBC/hpf (ref 0–5)

## 2018-09-04 LAB — URINALYSIS, ROUTINE W REFLEX MICROSCOPIC
Bilirubin Urine: NEGATIVE
Glucose, UA: NEGATIVE mg/dL
Ketones, ur: NEGATIVE mg/dL
Leukocytes,Ua: NEGATIVE
Nitrite: NEGATIVE
Protein, ur: NEGATIVE mg/dL
Specific Gravity, Urine: >1.03 — ABNORMAL HIGH (ref 1.005–1.030)
pH: 5.5 (ref 5.0–8.0)

## 2018-09-04 LAB — PREGNANCY, URINE: Preg Test, Ur: NEGATIVE

## 2018-09-04 LAB — CBG MONITORING, ED: Glucose-Capillary: 177 mg/dL — ABNORMAL HIGH (ref 70–99)

## 2018-09-04 NOTE — ED Provider Notes (Signed)
[facility] EMERGENCY DEPARTMENT Provider Note   CSN: [id number] Arrival date & time: 09/04/18  0111     History   Chief Complaint No chief complaint on file.   HPI Dawn Shields is a 33 y.o. female.     The history is provided by the patient.  Hyperglycemia Blood sugar level PTA:  200 Severity:  Mild Onset quality:  Gradual Timing:  Constant Progression:  Improving Chronicity:  New Diabetes status:  Controlled with insulin, controlled with oral medications and controlled with diet Current diabetic therapy:  Levemir, metformin and seeing a nutritionist Context: new diabetes diagnosis   Relieved by:  Nothing Ineffective treatments:  None tried Associated symptoms: no abdominal pain, no chest pain, no diaphoresis, no dizziness, no dysuria, no fever, no shortness of breath and no weakness   Risk factors: obesity   Patient with DM and HBA1c 13.4 presents with vision having changed and she wears readers at baseline but now has trouble with distance. She was told by ophthalmology today that her retina was normal and they would see her again in 6 weeks and decided on a new RX once her sugar was normal as they did not want to make multiple changes to RX.  Vision issues persist despite sugars improving.    Past Medical History:  Diagnosis Date  . Migraine     Patient Active Problem List   Diagnosis Date Noted  . Newly diagnosed diabetes (HCC) 08/29/2018  . Yeast vaginitis 08/17/2018  . History of migraine 08/17/2018  . Elevated LDL cholesterol level 08/17/2018  . Elevated pulse rate 08/17/2018  . Grieving 08/17/2018  . INSOMNIA, MILD 06/02/2009  . GLUCOSE INTOLERANCE 03/27/2008  . HYPERCHOLESTEROLEMIA 03/27/2008  . ANEMIA 03/27/2008  . OBESITY 03/26/2008  . VIRAL URI 03/17/2008  . COMMON MIGRAINE 03/05/2008  . GERD 03/05/2008  . ACNE VULGARIS 03/05/2008  . CHEST PAIN 03/05/2008  . ELEVATED BP READING WITHOUT DX HYPERTENSION 03/05/2008    History  reviewed. No pertinent surgical history.   OB History   No obstetric history on file.      Home Medications    Prior to Admission medications   Medication Sig Start Date End Date Taking? Authorizing Provider  ferrous sulfate 325 (65 FE) MG tablet Take 1 tablet (325 mg total) by mouth daily with breakfast. 08/20/18   Mliss SaxKremer, William Alfred, MD  insulin detemir (LEVEMIR) 100 unit/ml SOLN Inject 0.15 mLs (15 Units total) into the skin at bedtime. May adjust upward by 4Units EVERY 3 DAYS until fasting sugars are < 140 but >90 08/21/18   Mliss SaxKremer, William Alfred, MD  Insulin Pen Needle (B-D UF III MINI PEN NEEDLES) 31G X 5 MM MISC To use with levemir 08/23/18   Mliss SaxKremer, William Alfred, MD  Insulin Syringes, Disposable, (B-D INSULIN SYRINGE 1CC) U-100 1 ML MISC To use with Levemir. 08/22/18   Mliss SaxKremer, William Alfred, MD  metFORMIN (GLUCOPHAGE) 500 MG tablet Take 1 tablet (500 mg total) by mouth 2 (two) times daily with a meal. 08/15/18   Mardella LaymanHagler, Brian, MD    Family History Family History  Problem Relation Age of Onset  . Diabetes Mother   . Heart disease Mother     Social History Social History   Tobacco Use  . Smoking status: Never Smoker  . Smokeless tobacco: Never Used  Substance Use Topics  . Alcohol use: No  . Drug use: No     Allergies   Erythromycin   Review of Systems Review of Systems  Constitutional: Negative for diaphoresis and fever.  Eyes: Positive for visual disturbance.  Respiratory: Negative for cough and shortness of breath.   Cardiovascular: Negative for chest pain.  Gastrointestinal: Negative for abdominal pain.  Genitourinary: Negative for dysuria.  Neurological: Negative for dizziness, facial asymmetry, speech difficulty, weakness, light-headedness and headaches.  All other systems reviewed and are negative.    Physical Exam Updated Vital Signs There were no vitals taken for this visit.  Physical Exam Vitals signs and nursing note reviewed.   Constitutional:      Appearance: She is obese. She is not ill-appearing.  HENT:     Head: Normocephalic and atraumatic.     Nose: Nose normal.     Mouth/Throat:     Mouth: Mucous membranes are moist.     Pharynx: Oropharynx is clear.  Eyes:     Extraocular Movements: Extraocular movements intact.     Conjunctiva/sclera: Conjunctivae normal.     Pupils: Pupils are equal, round, and reactive to light.     Funduscopic exam:    Right eye: No AV nicking or papilledema. Venous pulsations present.        Left eye: No AV nicking or papilledema. Venous pulsations present. Neck:     Musculoskeletal: Normal range of motion and neck supple.  Cardiovascular:     Rate and Rhythm: Normal rate and regular rhythm.     Pulses: Normal pulses.     Heart sounds: Normal heart sounds.  Pulmonary:     Effort: Pulmonary effort is normal.     Breath sounds: Normal breath sounds.  Abdominal:     General: Abdomen is flat. Bowel sounds are normal.     Tenderness: There is no abdominal tenderness.  Musculoskeletal: Normal range of motion.  Skin:    General: Skin is warm and dry.     Capillary Refill: Capillary refill takes less than 2 seconds.  Neurological:     General: No focal deficit present.     Mental Status: She is alert and oriented to person, place, and time.  Psychiatric:        Mood and Affect: Mood normal.        Behavior: Behavior normal.      ED Treatments / Results  Labs (all labs ordered are listed, but only abnormal results are displayed) Results for orders placed or performed during the hospital encounter of 09/04/18  CBC with Differential/Platelet  Result Value Ref Range   WBC 10.0 4.0 - 10.5 K/uL   RBC 4.69 3.87 - 5.11 MIL/uL   Hemoglobin 9.5 (L) 12.0 - 15.0 g/dL   HCT 34.6 (L) 36.0 - 46.0 %   MCV 73.8 (L) 80.0 - 100.0 fL   MCH 20.3 (L) 26.0 - 34.0 pg   MCHC 27.5 (L) 30.0 - 36.0 g/dL   RDW 17.0 (H) 11.5 - 15.5 %   Platelets 512 (H) 150 - 400 K/uL   nRBC 0.0 0.0 - 0.2 %    Neutrophils Relative % 55 %   Neutro Abs 5.6 1.7 - 7.7 K/uL   Lymphocytes Relative 37 %   Lymphs Abs 3.7 0.7 - 4.0 K/uL   Monocytes Relative 5 %   Monocytes Absolute 0.5 0.1 - 1.0 K/uL   Eosinophils Relative 2 %   Eosinophils Absolute 0.2 0.0 - 0.5 K/uL   Basophils Relative 0 %   Basophils Absolute 0.0 0.0 - 0.1 K/uL   Immature Granulocytes 1 %   Abs Immature Granulocytes 0.06 0.00 - 0.07 K/uL  Basic  metabolic panel  Result Value Ref Range   Sodium 135 135 - 145 mmol/L   Potassium 3.8 3.5 - 5.1 mmol/L   Chloride 100 98 - 111 mmol/L   CO2 24 22 - 32 mmol/L   Glucose, Bld 193 (H) 70 - 99 mg/dL   BUN 13 6 - 20 mg/dL   Creatinine, Ser 4.090.72 0.44 - 1.00 mg/dL   Calcium 9.1 8.9 - 81.110.3 mg/dL   GFR calc non Af Amer >60 >60 mL/min   GFR calc Af Amer >60 >60 mL/min   Anion gap 11 5 - 15  Urinalysis, Routine w reflex microscopic  Result Value Ref Range   Color, Urine ORANGE (A) YELLOW   APPearance CLOUDY (A) CLEAR   Specific Gravity, Urine >1.030 (H) 1.005 - 1.030   pH 5.5 5.0 - 8.0   Glucose, UA NEGATIVE NEGATIVE mg/dL   Hgb urine dipstick LARGE (A) NEGATIVE   Bilirubin Urine NEGATIVE NEGATIVE   Ketones, ur NEGATIVE NEGATIVE mg/dL   Protein, ur NEGATIVE NEGATIVE mg/dL   Nitrite NEGATIVE NEGATIVE   Leukocytes,Ua NEGATIVE NEGATIVE  Pregnancy, urine  Result Value Ref Range   Preg Test, Ur NEGATIVE NEGATIVE  Urinalysis, Microscopic (reflex)  Result Value Ref Range   RBC / HPF >50 0 - 5 RBC/hpf   WBC, UA 0-5 0 - 5 WBC/hpf   Bacteria, UA FEW (A) NONE SEEN   Squamous Epithelial / LPF 6-10 0 - 5   Mucus PRESENT   CBG monitoring, ED  Result Value Ref Range   Glucose-Capillary 177 (H) 70 - 99 mg/dL   No results found.  EKG    Radiology No results found.  Procedures Procedures (including critical care time)  Medications Ordered in ED Medications - No data to display   Initial Impression / Assessment and Plan / ED Course  Patient seen today by ophthalmology and told she  needs distance lenses.  I do not think her sugar has anything to do with her vision.  I believe she needs bifocals.  Her sugars are in the 100s here and are essentially normal compared to her A1c  and I do not want to make her relatively hypoglycemia and make her symptomatic.  I have instructed her to follow up with ophtho to discuss lens correction.     Blood in urine is secondary to her menstrual cycle.   Final Clinical Impressions(s) / ED Diagnoses   Return for intractable cough, coughing up blood,fevers >100.4 unrelieved by medication, shortness of breath, intractable vomiting, chest pain, shortness of breath, weakness,numbness, changes in speech, facial asymmetry,abdominal pain, passing out,Inability to tolerate liquids or food, cough, altered mental status or any concerns. No signs of systemic illness or infection. The patient is nontoxic-appearing on exam and vital signs are within normal limits.   I have reviewed the triage vital signs and the nursing notes. Pertinent labs &imaging results that were available during my care of the patient were reviewed by me and considered in my medical decision making (see chart for details).  After history, exam, and medical workup I feel the patient has been appropriately medically screened and is safe for discharge home. Pertinent diagnoses were discussed with the patient. Patient was given return precautions     Ardra Kuznicki, MD 09/04/18 [id number]

## 2018-09-04 NOTE — ED Triage Notes (Signed)
Pt states she was diagnosed with diabetes about 3 weeks ago  Pt states her blood sugar is running consistently in the 200s  Pt states she is having blurred vision  Pt was seen by her eye doctor on Monday

## 2018-09-06 ENCOUNTER — Telehealth: Payer: Self-pay

## 2018-09-06 MED ORDER — ONETOUCH DELICA LANCETS 30G MISC: 1.0000 | Freq: Every day | 12 refills | Status: DC

## 2018-09-06 MED ORDER — ONETOUCH DELICA LANCING DEV MISC: 0 refills | Status: DC

## 2018-09-06 MED ORDER — ONETOUCH VERIO VI STRP: ORAL_STRIP | 12 refills | Status: DC

## 2018-09-06 NOTE — Telephone Encounter (Signed)

## 2018-09-07 ENCOUNTER — Ambulatory Visit (INDEPENDENT_AMBULATORY_CARE_PROVIDER_SITE_OTHER): Payer: BC Managed Care – PPO | Admitting: Family Medicine

## 2018-09-07 ENCOUNTER — Encounter: Payer: Self-pay | Admitting: Family Medicine

## 2018-09-07 VITALS — BP 120/76 | HR 98 | Ht 71.0 in | Wt 268.1 lb

## 2018-09-07 DIAGNOSIS — D500 Iron deficiency anemia secondary to blood loss (chronic): Secondary | ICD-10-CM | POA: Diagnosis not present

## 2018-09-07 DIAGNOSIS — N920 Excessive and frequent menstruation with regular cycle: Secondary | ICD-10-CM

## 2018-09-07 DIAGNOSIS — E0865 Diabetes mellitus due to underlying condition with hyperglycemia: Secondary | ICD-10-CM | POA: Diagnosis not present

## 2018-09-07 DIAGNOSIS — D649 Anemia, unspecified: Secondary | ICD-10-CM

## 2018-09-07 MED ORDER — FERROUS SULFATE 325 (65 FE) MG PO TABS: 325.0000 mg | ORAL_TABLET | Freq: Two times a day (BID) | ORAL | 1 refills | Status: DC

## 2018-09-07 NOTE — Progress Notes (Signed)
Established Patient Office Visit  Subjective:  Patient ID: Dawn Shields, female    DOB: [birth year]  Age: 33 y.o. MRN: [id number]  CC:  Chief Complaint  Patient presents with  . Follow-up    HPI Dawn Shields presents for follow-up of her diabetes status post initiation of insulin therapy.  Fasting sugars are coming down.  She has seen the clinical nutritionist.  She did see the ophthalmologist and had a dilated exam no evidence of diabetic retinopathy.  Her vision is not blurred should be dealt.  She was actually seen in the emergency room a few ago for vision check.  She has follow-up scheduled with her ophthalmologist in 1 month.  She will see Dr. Loanne Drilling on the eighth.  We discussed the possibility of preprandial insulin.  She has a standing history of menorrhagia with her monthly cycle.  She has not had GYN care in some time now.  She is tolerating the iron pill daily.  She is having no issues with the Glucophage as well.  She has increased time spent exercising and decrease the carbohydrates in her diet.  Past Medical History:  Diagnosis Date  . Diabetes mellitus without complication ([place])   . Migraine     History reviewed. No pertinent surgical history.  Family History  Problem Relation Age of Onset  . Diabetes Mother   . Heart disease Mother     Social History   Socioeconomic History  . Marital status: Single    Spouse name: Not on file  . Number of children: Not on file  . Years of education: Not on file  . Highest education level: Not on file  Occupational History  . Not on file  Social Needs  . Financial resource strain: Not on file  . Food insecurity    Worry: Not on file    Inability: Not on file  . Transportation needs    Medical: Not on file    Non-medical: Not on file  Tobacco Use  . Smoking status: Never Smoker  . Smokeless tobacco: Never Used  Substance and Sexual Activity  . Alcohol use: No  . Drug use: No  . Sexual activity: Not on file   Lifestyle  . Physical activity    Days per week: Not on file    Minutes per session: Not on file  . Stress: Not on file  Relationships  . Social Herbalist on phone: Not on file    Gets together: Not on file    Attends religious service: Not on file    Active member of club or organization: Not on file    Attends meetings of clubs or organizations: Not on file    Relationship status: Not on file  . Intimate partner violence    Fear of current or ex partner: Not on file    Emotionally abused: Not on file    Physically abused: Not on file    Forced sexual activity: Not on file  Other Topics Concern  . Not on file  Social History Narrative  . Not on file    Outpatient Medications Prior to Visit  Medication Sig Dispense Refill  . glucose blood (ONETOUCH VERIO) test strip Use to test blood sugars 1-2 times daily. 100 each 12  . insulin detemir (LEVEMIR) 100 unit/ml SOLN Inject 0.15 mLs (15 Units total) into the skin at bedtime. May adjust upward by 4Units EVERY 3 DAYS until fasting sugars are < 140 but >  90 12 mL 2  . Insulin Pen Needle (B-D UF III MINI PEN NEEDLES) 31G X 5 MM MISC To use with levemir 100 each 3  . Insulin Syringes, Disposable, (B-D INSULIN SYRINGE 1CC) U-100 1 ML MISC To use with Levemir. 100 each 2  . Lancet Devices (ONE TOUCH DELICA LANCING DEV) MISC To use to test blood sugars 1-2 times daily. 1 each 0  . metFORMIN (GLUCOPHAGE) 500 MG tablet Take 1 tablet (500 mg total) by mouth 2 (two) times daily with a meal. 60 tablet 1  . OneTouch Delica Lancets 30G MISC 1 each by Does not apply route daily. Use to test blood sugars 1-2 times daily. 100 each 12  . ferrous sulfate 325 (65 FE) MG tablet Take 1 tablet (325 mg total) by mouth daily with breakfast. 90 tablet 3   No facility-administered medications prior to visit.     Allergies  Allergen Reactions  . Erythromycin Rash    ROS Review of Systems  Constitutional: Negative.   Eyes: Positive for visual  disturbance.  Respiratory: Negative.   Cardiovascular: Negative.   Gastrointestinal: Negative.   Endocrine: Negative for polyphagia and polyuria.  Musculoskeletal: Negative for joint swelling and myalgias.  Neurological: Negative for weakness.  Psychiatric/Behavioral: Negative.       Objective:    Physical Exam  Constitutional: She is oriented to person, place, and time. She appears well-developed and well-nourished. No distress.  HENT:  Head: Normocephalic and atraumatic.  Right Ear: External ear normal.  Left Ear: External ear normal.  Eyes: Conjunctivae are normal. Right eye exhibits no discharge. Left eye exhibits no discharge. No scleral icterus.  Neck: No JVD present. No tracheal deviation present.  Pulmonary/Chest: Effort normal. No stridor.  Neurological: She is alert and oriented to person, place, and time.  Skin: She is not diaphoretic.  Psychiatric: She has a normal mood and affect. Her behavior is normal.    BP 120/76   Pulse 98   Ht 5\' 11"  (1.803 m)   Wt 268 lb 2 oz (121.6 kg)   LMP 09/02/2018 (Exact Date)   SpO2 98%   BMI 37.40 kg/m  Wt Readings from Last 3 Encounters:  09/07/18 268 lb 2 oz (121.6 kg)  09/04/18 271 lb (122.9 kg)  08/17/18 271 lb 4 oz (123 kg)   BP Readings from Last 3 Encounters:  09/07/18 120/76  09/04/18 (!) 144/92  08/17/18 128/70   Guideline developer:  UpToDate (see UpToDate for funding source) Date Released: June 2014  Health Maintenance Due  Topic Date Due  . PNEUMOCOCCAL POLYSACCHARIDE VACCINE AGE 76-64 HIGH RISK  06/10/1986  . FOOT EXAM  06/10/1994  . OPHTHALMOLOGY EXAM  06/10/1994  . URINE MICROALBUMIN  06/10/1994  . HIV Screening  06/10/1999  . TETANUS/TDAP  06/10/2003  . PAP SMEAR-Modifier  06/09/2005    There are no preventive care reminders to display for this patient.  Lab Results  Component Value Date   TSH 0.92 08/17/2018   Lab Results  Component Value Date   WBC 10.0 09/04/2018   HGB 9.5 (L) 09/04/2018    HCT 34.6 (L) 09/04/2018   MCV 73.8 (L) 09/04/2018   PLT 512 (H) 09/04/2018   Lab Results  Component Value Date   NA 135 09/04/2018   K 3.8 09/04/2018   CO2 24 09/04/2018   GLUCOSE 193 (H) 09/04/2018   BUN 13 09/04/2018   CREATININE 0.72 09/04/2018   BILITOT 0.4 08/17/2018   ALKPHOS 64 01/22/2018   AST  19 08/17/2018   ALT 22 08/17/2018   PROT 7.5 08/17/2018   ALBUMIN 4.0 01/22/2018   CALCIUM 9.1 09/04/2018   ANIONGAP 11 09/04/2018   Lab Results  Component Value Date   CHOL 176 08/17/2018   Lab Results  Component Value Date   HDL 36 (L) 08/17/2018   Lab Results  Component Value Date   LDLCALC 106 (H) 08/17/2018   Lab Results  Component Value Date   TRIG 225 (H) 08/17/2018   Lab Results  Component Value Date   CHOLHDL 4.9 08/17/2018   Lab Results  Component Value Date   HGBA1C 13.4 (H) 08/17/2018      Assessment & Plan:   Problem List Items Addressed This Visit      Endocrine   Diabetes mellitus due to underlying condition, uncontrolled, with hyperglycemia (HCC) - Primary     Other   ANEMIA   Relevant Medications   ferrous sulfate 325 (65 FE) MG tablet   Iron deficiency anemia due to chronic blood loss   Relevant Medications   ferrous sulfate 325 (65 FE) MG tablet   Menorrhagia with regular cycle   Relevant Orders   Ambulatory referral to Gynecology      Meds ordered this encounter  Medications  . ferrous sulfate 325 (65 FE) MG tablet    Sig: Take 1 tablet (325 mg total) by mouth 2 (two) times daily with a meal.    Dispense:  180 tablet    Refill:  1    Follow-up: Return in about 3 months (around 12/08/2018).   Encouraged her to continue positive lifestyle changes.  She will increase her iron to twice daily.  Discussed again increasing her Levemir every 3 days x 2 to 4 units until her fasting sugars are in the less than 140 range consistently.

## 2018-09-11 ENCOUNTER — Telehealth: Payer: Self-pay

## 2018-09-11 MED ORDER — CONTOUR NEXT MONITOR W/DEVICE KIT: 1.0000 | PACK | Freq: Every day | 0 refills | Status: DC

## 2018-09-11 MED ORDER — CONTOUR NEXT TEST VI STRP: ORAL_STRIP | 12 refills | Status: DC

## 2018-09-11 MED ORDER — BAYER MICROLET LANCETS MISC: 12 refills | Status: DC

## 2018-09-11 NOTE — Telephone Encounter (Signed)
Yes

## 2018-09-11 NOTE — Telephone Encounter (Signed)
I started the PA on the onetouch, but insurance will cover Contour Next, okay to switch?        Copied from [place] [id number]. Topic: General - Other >> Sep 11, 2018  8:22 AM Carolyn Stare wrote: Pt call to say her insurance ir requiring a PA for the below meds    glucose blood (ONETOUCH VERIO) test strip    OneTouch Delica Lancets 32X MISC

## 2018-09-11 NOTE — Telephone Encounter (Signed)
Rx's sent. Patient is aware that this is what her insurance will cover.

## 2018-09-11 NOTE — Addendum Note (Signed)
Addended by: Rodrigo Ran on: 09/11/2018 01:15 PM   Modules accepted: Orders

## 2018-09-24 ENCOUNTER — Other Ambulatory Visit: Payer: Self-pay

## 2018-09-26 ENCOUNTER — Ambulatory Visit: Payer: BC Managed Care – PPO | Admitting: Endocrinology

## 2018-10-02 ENCOUNTER — Ambulatory Visit: Payer: Self-pay | Admitting: Women's Health

## 2018-10-04 ENCOUNTER — Ambulatory Visit (INDEPENDENT_AMBULATORY_CARE_PROVIDER_SITE_OTHER): Payer: BC Managed Care – PPO | Admitting: Endocrinology

## 2018-10-04 ENCOUNTER — Other Ambulatory Visit: Payer: Self-pay

## 2018-10-04 ENCOUNTER — Encounter: Payer: Self-pay | Admitting: Endocrinology

## 2018-10-04 ENCOUNTER — Ambulatory Visit: Payer: BLUE CROSS/BLUE SHIELD | Admitting: Registered"

## 2018-10-04 VITALS — BP 124/84 | HR 101 | Ht 71.0 in | Wt 268.6 lb

## 2018-10-04 DIAGNOSIS — E0865 Diabetes mellitus due to underlying condition with hyperglycemia: Secondary | ICD-10-CM

## 2018-10-04 DIAGNOSIS — E1165 Type 2 diabetes mellitus with hyperglycemia: Secondary | ICD-10-CM | POA: Diagnosis not present

## 2018-10-04 LAB — POCT GLYCOSYLATED HEMOGLOBIN (HGB A1C): Hemoglobin A1C: 8.4 % — AB (ref 4.0–5.6)

## 2018-10-04 MED ORDER — SAXAGLIPTIN HCL 5 MG PO TABS: 5.0000 mg | ORAL_TABLET | Freq: Every day | ORAL | 11 refills | Status: DC

## 2018-10-04 NOTE — Patient Instructions (Addendum)
good diet and exercise significantly improve the control of your diabetes.  please let me know if you wish to be referred to a dietician.  high blood sugar is very risky to your health.  you should see an eye doctor and dentist every year.  It is very important to get all recommended vaccinations.  Controlling your blood pressure and cholesterol drastically reduces the damage diabetes does to your body.  Those who smoke should quit.  Please discuss these with your doctor.  check your blood sugar once a day.  vary the time of day when you check, between before the 3 meals, and at bedtime.  also check if you have symptoms of your blood sugar being too high or too low.  please keep a record of the readings and bring it to your next appointment here (or you can bring the meter itself).  You can write it on any piece of paper.  please call us sooner if your blood sugar goes below 70, or if you have a lot of readings over 200. Please continue the same metformin, and:  I have sent a prescription to your pharmacy, to add "Onglyza."  This will replace the insulin.  Please come back for a follow-up appointment in 1 month.

## 2018-10-04 NOTE — Progress Notes (Signed)
Subjective:    Patient ID: Dawn Shields, female    DOB: [birth year], 33 y.o.   MRN: [id number]  HPI pt is referred by Dr. Ethelene Hal, for diabetes.  Pt states DM was dx'ed 1 month ago (RBS was 191 in 2019); she has mild if any neuropathy of the lower extremities; she is unaware of any associated chronic complications; she has been on insulin since dx; pt says her diet and exercise are improved since dx; she has never had GDM (G0), pancreatitis, pancreatic surgery, severe hypoglycemia or DKA.  She works 5 PM-3 AM.  She takes levemir, 15 units qhs.  she brings a record of her cbg's which I have reviewed today.  All are checked fasting.  cbg varies from 96-174.   Past Medical History:  Diagnosis Date  . Diabetes mellitus without complication ([place])   . Migraine     No past surgical history on file.  Social History   Socioeconomic History  . Marital status: Single    Spouse name: Not on file  . Number of children: Not on file  . Years of education: Not on file  . Highest education level: Not on file  Occupational History  . Not on file  Social Needs  . Financial resource strain: Not on file  . Food insecurity    Worry: Not on file    Inability: Not on file  . Transportation needs    Medical: Not on file    Non-medical: Not on file  Tobacco Use  . Smoking status: Never Smoker  . Smokeless tobacco: Never Used  Substance and Sexual Activity  . Alcohol use: No  . Drug use: No  . Sexual activity: Not on file  Lifestyle  . Physical activity    Days per week: Not on file    Minutes per session: Not on file  . Stress: Not on file  Relationships  . Social Herbalist on phone: Not on file    Gets together: Not on file    Attends religious service: Not on file    Active member of club or organization: Not on file    Attends meetings of clubs or organizations: Not on file    Relationship status: Not on file  . Intimate partner violence    Fear of current or ex partner:  Not on file    Emotionally abused: Not on file    Physically abused: Not on file    Forced sexual activity: Not on file  Other Topics Concern  . Not on file  Social History Narrative  . Not on file    Current Outpatient Medications on File Prior to Visit  Medication Sig Dispense Refill  . Bayer Microlet Lancets lancets Use to test blood sugars 1-2 times daily. (Patient taking differently: 1 each by Other route daily. Use to test blood sugars daily.) 100 each 12  . Blood Glucose Monitoring Suppl (CONTOUR NEXT MONITOR) w/Device KIT 1 each by Does not apply route daily. 1 kit 0  . ferrous sulfate 325 (65 FE) MG tablet Take 1 tablet (325 mg total) by mouth 2 (two) times daily with a meal. 180 tablet 1  . glucose blood (CONTOUR NEXT TEST) test strip Use to test blood sugars one-two times daily. 100 each 12  . metFORMIN (GLUCOPHAGE) 500 MG tablet Take 1 tablet (500 mg total) by mouth 2 (two) times daily with a meal. 60 tablet 1   No current facility-administered medications on file  prior to visit.     Allergies  Allergen Reactions  . Erythromycin Rash    Family History  Problem Relation Age of Onset  . Diabetes Mother   . Heart disease Mother     BP 124/84 (BP Location: Right Arm, Patient Position: Sitting, Cuff Size: Large)   Pulse (!) 101   Ht _0  (1.803 m)   Wt 268 lb 9.6 oz (121.8 kg)   SpO2 97%   BMI 37.46 kg/m    Review of Systems denies headache, chest pain, sob, n/v, urinary frequency, muscle cramps, excessive diaphoresis, memory loss, depression, cold intolerance, rhinorrhea, and easy bruising.  She developed blurry vision after she started insulin.  She has lost 13 lbs.       Objective:   Physical Exam VS: see vs page GEN: no distress HEAD: head: no deformity eyes: no periorbital swelling, no proptosis external nose and ears are normal.  NECK: supple, thyroid is not enlarged CHEST WALL: no deformity LUNGS: clear to auscultation CV: reg rate and rhythm, no  murmur ABD: abdomen is soft, nontender.  no hepatosplenomegaly.  not distended.  no hernia MUSCULOSKELETAL: muscle bulk and strength are grossly normal.  no obvious joint swelling.  gait is normal and steady EXTEMITIES: no deformity.  no ulcer on the feet.  feet are of normal color and temp.  Trace bilat leg edema PULSES: dorsalis pedis intact bilat.  no carotid bruit NEURO:  cn 2-12 grossly intact.   readily moves all 4's.  sensation is intact to touch on the feet SKIN:  Normal texture and temperature.  No rash or suspicious lesion is visible.   NODES:  None palpable at the neck PSYCH: alert, well-oriented.  Does not appear anxious nor depressed.     Lab Results  Component Value Date   HGBA1C 13.4 (H) 08/17/2018   Lab Results  Component Value Date   CREATININE 0.72 09/04/2018   BUN 13 09/04/2018   NA 135 09/04/2018   K 3.8 09/04/2018   CL 100 09/04/2018   CO2 24 09/04/2018   Lab Results  Component Value Date   TSH 0.92 08/17/2018   I have reviewed outside records, and summarized: Pt was noted to have elevated a1c, and referred here.  She was started on insulin.  Insulin education was provided by pharmacist.        Assessment & Plan:  Type 2 DM, new.  Despite severe hyperglycemia, this usually responds well in the setting of new dx, and young age.   Patient Instructions  good diet and exercise significantly improve the control of your diabetes.  please let me know if you wish to be referred to a dietician.  high blood sugar is very risky to your health.  you should see an eye doctor and dentist every year.  It is very important to get all recommended vaccinations.  Controlling your blood pressure and cholesterol drastically reduces the damage diabetes does to your body.  Those who smoke should quit.  Please discuss these with your doctor.  check your blood sugar once a day.  vary the time of day when you check, between before the 3 meals, and at bedtime.  also check if you have  symptoms of your blood sugar being too high or too low.  please keep a record of the readings and bring it to your next appointment here (or you can bring the meter itself).  You can write it on any piece of paper.  please call us  sooner if your blood sugar goes below 70, or if you have a lot of readings over 200. Please continue the same metformin, and:  I have sent a prescription to your pharmacy, to add "Onglyza."  This will replace the insulin.  Please come back for a follow-up appointment in 1 month.

## 2018-10-07 DIAGNOSIS — R079 Chest pain, unspecified: Secondary | ICD-10-CM | POA: Diagnosis not present

## 2018-10-10 ENCOUNTER — Other Ambulatory Visit: Payer: Self-pay | Admitting: Family Medicine

## 2018-10-10 MED ORDER — METFORMIN HCL 500 MG PO TABS: 500.0000 mg | ORAL_TABLET | Freq: Two times a day (BID) | ORAL | 5 refills | Status: DC

## 2018-10-10 NOTE — Telephone Encounter (Signed)
Medication Refill - Medication: metFORMIN (GLUCOPHAGE) 500 MG tablet   Has the patient contacted their pharmacy? Yes.   (Agent: If no, request that the patient contact the pharmacy for the refill.) (Agent: If yes, when and what did the pharmacy advise?)  Preferred Pharmacy (with phone number or street name):  [id number] [address], [place]  [place] [address]  Phone: [phone] Fax: [phone]   Agent: Please be advised that RX refills may take up to 3 business days. We ask that you follow-up with your pharmacy.

## 2018-10-10 NOTE — Telephone Encounter (Signed)
Rx sent in

## 2018-10-24 ENCOUNTER — Ambulatory Visit: Payer: Self-pay | Admitting: Women's Health

## 2018-11-15 ENCOUNTER — Ambulatory Visit: Payer: BC Managed Care – PPO | Admitting: Endocrinology

## 2018-11-21 ENCOUNTER — Ambulatory Visit: Payer: Self-pay | Admitting: Women's Health

## 2018-11-22 ENCOUNTER — Other Ambulatory Visit: Payer: Self-pay

## 2018-11-22 ENCOUNTER — Telehealth: Payer: Self-pay

## 2018-11-22 NOTE — Telephone Encounter (Signed)
I tried to call but had to leave a message 

## 2018-11-22 NOTE — Telephone Encounter (Signed)
Pt declined appt at this time. Stated she would call back.

## 2018-11-22 NOTE — Telephone Encounter (Signed)
Pt is having symptoms of a uti. Can we get her scheduled for an appt?

## 2018-11-23 ENCOUNTER — Encounter: Payer: Self-pay | Admitting: Family Medicine

## 2018-11-23 ENCOUNTER — Ambulatory Visit: Payer: BC Managed Care – PPO | Admitting: Family Medicine

## 2018-11-23 VITALS — BP 110/70 | HR 94 | Ht 71.0 in

## 2018-11-23 DIAGNOSIS — D500 Iron deficiency anemia secondary to blood loss (chronic): Secondary | ICD-10-CM | POA: Diagnosis not present

## 2018-11-23 DIAGNOSIS — R300 Dysuria: Secondary | ICD-10-CM

## 2018-11-23 DIAGNOSIS — B3731 Acute candidiasis of vulva and vagina: Secondary | ICD-10-CM

## 2018-11-23 DIAGNOSIS — B373 Candidiasis of vulva and vagina: Secondary | ICD-10-CM

## 2018-11-23 LAB — POCT URINALYSIS DIPSTICK
Bilirubin, UA: NEGATIVE
Blood, UA: NEGATIVE
Glucose, UA: NEGATIVE
Ketones, UA: NEGATIVE
Leukocytes, UA: NEGATIVE
Nitrite, UA: NEGATIVE
Protein, UA: NEGATIVE
Spec Grav, UA: 1.02 (ref 1.010–1.025)
Urobilinogen, UA: 1 E.U./dL
pH, UA: 6 (ref 5.0–8.0)

## 2018-11-23 LAB — URINALYSIS, ROUTINE W REFLEX MICROSCOPIC
Bilirubin Urine: NEGATIVE
Hgb urine dipstick: NEGATIVE
Ketones, ur: NEGATIVE
Leukocytes,Ua: NEGATIVE
Nitrite: NEGATIVE
RBC / HPF: NONE SEEN (ref 0–?)
Specific Gravity, Urine: 1.025 (ref 1.000–1.030)
Total Protein, Urine: NEGATIVE
Urine Glucose: NEGATIVE
Urobilinogen, UA: 0.2 (ref 0.0–1.0)
pH: 6.5 (ref 5.0–8.0)

## 2018-11-23 LAB — CBC
HCT: 30.3 % — ABNORMAL LOW (ref 36.0–46.0)
Hemoglobin: 9.1 g/dL — ABNORMAL LOW (ref 12.0–15.0)
MCHC: 30.1 g/dL (ref 30.0–36.0)
MCV: 67.9 fl — ABNORMAL LOW (ref 78.0–100.0)
Platelets: 560 10*3/uL — ABNORMAL HIGH (ref 150.0–400.0)
RBC: 4.46 Mil/uL (ref 3.87–5.11)
RDW: 16.5 % — ABNORMAL HIGH (ref 11.5–15.5)
WBC: 9.3 10*3/uL (ref 4.0–10.5)

## 2018-11-23 LAB — BASIC METABOLIC PANEL
BUN: 10 mg/dL (ref 6–23)
CO2: 27 mEq/L (ref 19–32)
Calcium: 9.5 mg/dL (ref 8.4–10.5)
Chloride: 103 mEq/L (ref 96–112)
Creatinine, Ser: 0.72 mg/dL (ref 0.40–1.20)
GFR: 111.89 mL/min (ref >60.00)
Glucose, Bld: 61 mg/dL — ABNORMAL LOW (ref 70–99)
Potassium: 4 mEq/L (ref 3.5–5.1)
Sodium: 137 mEq/L (ref 135–145)

## 2018-11-23 MED ORDER — FLUCONAZOLE 150 MG PO TABS: 150.0000 mg | ORAL_TABLET | Freq: Once | ORAL | 0 refills | Status: AC

## 2018-11-23 NOTE — Progress Notes (Addendum)
Established Patient Office Visit  Subjective:  Patient ID: Dawn Shields, female    DOB: Feb 08, 1986  Age: 33 y.o. MRN: [id number]  CC:  Chief Complaint  Patient presents with  . Urinary Tract Infection    HPI Dawn Shields presents for evaluation treatment of dysuria with vaginal itching.  She self medicated with a leftover Diflucan pill and her symptoms are improving.  Denies fevers chills frequency or urgency.  Urine dip today was normal.  Her diabetes is being treated with saxagliptin and metformin.  She is under much better control.  She was able to see the GYN for her female care.  She assures compliance with her iron therapy but she says that it does cause constipation and she has some difficulty taking it.  Patient denies blood in her urine or stool.  She denies melena.  She denies heavy menstrual flow.  Past Medical History:  Diagnosis Date  . Diabetes mellitus without complication ([place])   . Migraine     History reviewed. No pertinent surgical history.  Family History  Problem Relation Age of Onset  . Diabetes Mother   . Heart disease Mother     Social History   Socioeconomic History  . Marital status: Single    Spouse name: Not on file  . Number of children: Not on file  . Years of education: Not on file  . Highest education level: Not on file  Occupational History  . Not on file  Social Needs  . Financial resource strain: Not on file  . Food insecurity    Worry: Not on file    Inability: Not on file  . Transportation needs    Medical: Not on file    Non-medical: Not on file  Tobacco Use  . Smoking status: Never Smoker  . Smokeless tobacco: Never Used  Substance and Sexual Activity  . Alcohol use: No  . Drug use: No  . Sexual activity: Not on file  Lifestyle  . Physical activity    Days per week: Not on file    Minutes per session: Not on file  . Stress: Not on file  Relationships  . Social Herbalist on phone: Not on file    Gets  together: Not on file    Attends religious service: Not on file    Active member of club or organization: Not on file    Attends meetings of clubs or organizations: Not on file    Relationship status: Not on file  . Intimate partner violence    Fear of current or ex partner: Not on file    Emotionally abused: Not on file    Physically abused: Not on file    Forced sexual activity: Not on file  Other Topics Concern  . Not on file  Social History Narrative  . Not on file    Outpatient Medications Prior to Visit  Medication Sig Dispense Refill  . Bayer Microlet Lancets lancets Use to test blood sugars 1-2 times daily. (Patient taking differently: 1 each by Other route daily. Use to test blood sugars daily.) 100 each 12  . Blood Glucose Monitoring Suppl (CONTOUR NEXT MONITOR) w/Device KIT 1 each by Does not apply route daily. 1 kit 0  . ferrous sulfate 325 (65 FE) MG tablet Take 1 tablet (325 mg total) by mouth 2 (two) times daily with a meal. 180 tablet 1  . glucose blood (CONTOUR NEXT TEST) test strip Use to test  blood sugars one-two times daily. 100 each 12  . LEVEMIR FLEXTOUCH 100 UNIT/ML Pen     . metFORMIN (GLUCOPHAGE) 500 MG tablet Take 1 tablet (500 mg total) by mouth 2 (two) times daily with a meal. 60 tablet 5  . saxagliptin HCl (ONGLYZA) 5 MG TABS tablet Take 1 tablet (5 mg total) by mouth daily. 30 tablet 11   No facility-administered medications prior to visit.     Allergies  Allergen Reactions  . Erythromycin Rash    ROS Review of Systems  Constitutional: Negative.   HENT: Negative.   Eyes: Negative for photophobia and visual disturbance.  Respiratory: Negative.   Cardiovascular: Negative.   Gastrointestinal: Negative.   Endocrine: Negative for polyphagia and polyuria.  Genitourinary: Positive for dysuria. Negative for frequency, menstrual problem, urgency and vaginal discharge.  Musculoskeletal: Negative for back pain.  Skin: Negative for color change and  pallor.  Allergic/Immunologic: Negative for immunocompromised state.  Neurological: Negative for facial asymmetry and headaches.  Hematological: Does not bruise/bleed easily.  Psychiatric/Behavioral: Negative.       Objective:    Physical Exam  Constitutional: She is oriented to person, place, and time. She appears well-developed and well-nourished. No distress.  HENT:  Head: Normocephalic and atraumatic.  Right Ear: External ear normal.  Left Ear: External ear normal.  Mouth/Throat: Oropharynx is clear and moist. No oropharyngeal exudate.  Eyes: Pupils are equal, round, and reactive to light. Conjunctivae are normal. Right eye exhibits no discharge. Left eye exhibits no discharge. No scleral icterus.  Neck: No JVD present. No tracheal deviation present. No thyromegaly present.  Cardiovascular: Normal rate, regular rhythm and normal heart sounds.  Pulmonary/Chest: Effort normal and breath sounds normal. No stridor.  Abdominal: Bowel sounds are normal.  Lymphadenopathy:    She has no cervical adenopathy.  Neurological: She is alert and oriented to person, place, and time.  Skin: Skin is warm and dry. She is not diaphoretic.  Psychiatric: She has a normal mood and affect. Her behavior is normal.    BP 110/70   Pulse 94   Ht 5' 11" (1.803 m)   SpO2 97%   BMI 37.46 kg/m  Wt Readings from Last 3 Encounters:  10/04/18 268 lb 9.6 oz (121.8 kg)  09/07/18 268 lb 2 oz (121.6 kg)  09/04/18 271 lb (122.9 kg)   BP Readings from Last 3 Encounters:  11/23/18 110/70  10/04/18 124/84  09/07/18 120/76   Guideline developer:  UpToDate (see UpToDate for funding source) Date Released: June 2014  Health Maintenance Due  Topic Date Due  . PNEUMOCOCCAL POLYSACCHARIDE VACCINE AGE 26-64 HIGH RISK  06/10/1986  . FOOT EXAM  06/10/1994  . URINE MICROALBUMIN  06/10/1994  . HIV Screening  06/10/1999  . TETANUS/TDAP  06/10/2003  . PAP SMEAR-Modifier  06/09/2005  . INFLUENZA VACCINE  10/20/2018     There are no preventive care reminders to display for this patient.  Lab Results  Component Value Date   TSH 0.92 08/17/2018   Lab Results  Component Value Date   WBC 9.3 11/23/2018   HGB 9.1 (L) 11/23/2018   HCT 30.3 (L) 11/23/2018   MCV 67.9 Repeated and verified X2. (L) 11/23/2018   PLT 560.0 (H) 11/23/2018   Lab Results  Component Value Date   NA 137 11/23/2018   K 4.0 11/23/2018   CO2 27 11/23/2018   GLUCOSE 61 (L) 11/23/2018   BUN 10 11/23/2018   CREATININE 0.72 11/23/2018   BILITOT 0.4 08/17/2018  ALKPHOS 64 01/22/2018   AST 19 08/17/2018   ALT 22 08/17/2018   PROT 7.5 08/17/2018   ALBUMIN 4.0 01/22/2018   CALCIUM 9.5 11/23/2018   ANIONGAP 11 09/04/2018   GFR 111.89 11/23/2018   Lab Results  Component Value Date   CHOL 176 08/17/2018   Lab Results  Component Value Date   HDL 36 (L) 08/17/2018   Lab Results  Component Value Date   LDLCALC 106 (H) 08/17/2018   Lab Results  Component Value Date   TRIG 225 (H) 08/17/2018   Lab Results  Component Value Date   CHOLHDL 4.9 08/17/2018   Lab Results  Component Value Date   HGBA1C 8.4 (A) 10/04/2018      Assessment & Plan:   Problem List Items Addressed This Visit      Genitourinary   Yeast vaginitis     Other   Iron deficiency anemia due to chronic blood loss   Relevant Orders   CBC (Completed)   Iron, TIBC and Ferritin Panel (Completed)   Basic metabolic panel (Completed)   Ambulatory referral to Hematology    Other Visit Diagnoses    Dysuria    -  Primary   Relevant Orders   POCT urinalysis dipstick (Completed)   Urinalysis, Routine w reflex microscopic (Completed)      Meds ordered this encounter  Medications  . fluconazole (DIFLUCAN) 150 MG tablet    Sig: Take 1 tablet (150 mg total) by mouth once for 1 dose.    Dispense:  1 tablet    Refill:  0    Follow-up: Return in about 3 months (around 02/22/2019), or continue iron therapy. Follow up for elevated ldl cholesterol  and anemia in 3 months.Marland Kitchen

## 2018-11-24 LAB — IRON,TIBC AND FERRITIN PANEL
%SAT: 4 % (calc) — ABNORMAL LOW (ref 16–45)
Ferritin: 7 ng/mL — ABNORMAL LOW (ref 16–154)
Iron: 16 ug/dL — ABNORMAL LOW (ref 40–190)
TIBC: 381 mcg/dL (calc) (ref 250–450)

## 2018-11-27 NOTE — Addendum Note (Signed)
Addended by: Jon Billings on: 11/27/2018 03:06 PM   Modules accepted: Orders

## 2018-11-28 ENCOUNTER — Telehealth: Payer: Self-pay | Admitting: Family

## 2018-11-28 ENCOUNTER — Telehealth: Payer: Self-pay

## 2018-11-28 NOTE — Telephone Encounter (Signed)
I discussed it with her and told her that she may need a transfusion if her iron levels don't improve. She didn't tell me that she was not going.

## 2018-11-28 NOTE — Telephone Encounter (Signed)
Copied from [place] [id number]. Topic: General - Inquiry >> Nov 28, 2018  8:12 AM Scherrie Gerlach wrote: Reason for CRM: pt wants the dr to know she does NOT take the iron pill every day because it constipates her. So if pt needs to take this, she needs something that is counteracts this side effect.  Pt states she dirnks plenty of water, and eats fruits. So she has tried that. Pt also states she is not going to any more drs either.

## 2018-11-28 NOTE — Telephone Encounter (Signed)
Called and LMVM for patient with date/time/location of appointment.  New patient information was sent to her along with a schedule

## 2018-11-28 NOTE — Addendum Note (Signed)
Addended by: Jon Billings on: 11/28/2018 08:36 AM   Modules accepted: Orders

## 2018-12-14 ENCOUNTER — Emergency Department (HOSPITAL_COMMUNITY): Payer: BC Managed Care – PPO

## 2018-12-14 ENCOUNTER — Emergency Department (HOSPITAL_COMMUNITY)
Admission: EM | Admit: 2018-12-14 | Discharge: 2018-12-14 | Disposition: A | Discharge status: Home / Self Care | Payer: BC Managed Care – PPO | Attending: Emergency Medicine | Admitting: Emergency Medicine

## 2018-12-14 ENCOUNTER — Other Ambulatory Visit: Payer: Self-pay

## 2018-12-14 DIAGNOSIS — S0990XA Unspecified injury of head, initial encounter: Secondary | ICD-10-CM

## 2018-12-14 DIAGNOSIS — I959 Hypotension, unspecified: Secondary | ICD-10-CM | POA: Diagnosis not present

## 2018-12-14 DIAGNOSIS — Y939 Activity, unspecified: Secondary | ICD-10-CM | POA: Insufficient documentation

## 2018-12-14 DIAGNOSIS — Z794 Long term (current) use of insulin: Secondary | ICD-10-CM | POA: Insufficient documentation

## 2018-12-14 DIAGNOSIS — Y999 Unspecified external cause status: Secondary | ICD-10-CM | POA: Diagnosis not present

## 2018-12-14 DIAGNOSIS — Z79899 Other long term (current) drug therapy: Secondary | ICD-10-CM | POA: Insufficient documentation

## 2018-12-14 DIAGNOSIS — E119 Type 2 diabetes mellitus without complications: Secondary | ICD-10-CM | POA: Diagnosis not present

## 2018-12-14 DIAGNOSIS — R51 Headache: Secondary | ICD-10-CM | POA: Diagnosis not present

## 2018-12-14 DIAGNOSIS — Y929 Unspecified place or not applicable: Secondary | ICD-10-CM | POA: Diagnosis not present
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
## 2018-12-14 NOTE — Discharge Instructions (Signed)
Recommend Tylenol, Motrin as needed for pain control.  Recommend following up with primary doctor for recheck next week.  If you develop chest pain, difficulty breathing or other new concerning symptoms recommend returning to ER for reassessment.

## 2018-12-17 ENCOUNTER — Ambulatory Visit: Payer: BC Managed Care – PPO | Admitting: Family Medicine

## 2018-12-17 ENCOUNTER — Other Ambulatory Visit: Payer: Self-pay

## 2018-12-18 ENCOUNTER — Emergency Department (HOSPITAL_BASED_OUTPATIENT_CLINIC_OR_DEPARTMENT_OTHER): Payer: BC Managed Care – PPO

## 2018-12-18 ENCOUNTER — Encounter (HOSPITAL_BASED_OUTPATIENT_CLINIC_OR_DEPARTMENT_OTHER): Payer: Self-pay | Admitting: *Deleted

## 2018-12-18 ENCOUNTER — Emergency Department (HOSPITAL_BASED_OUTPATIENT_CLINIC_OR_DEPARTMENT_OTHER)
Admission: EM | Admit: 2018-12-18 | Discharge: 2018-12-18 | Disposition: A | Discharge status: Home / Self Care | Payer: BC Managed Care – PPO | Attending: Emergency Medicine | Admitting: Emergency Medicine

## 2018-12-18 ENCOUNTER — Other Ambulatory Visit: Payer: Self-pay

## 2018-12-18 ENCOUNTER — Other Ambulatory Visit: Payer: Self-pay | Admitting: Family

## 2018-12-18 DIAGNOSIS — R51 Headache: Secondary | ICD-10-CM | POA: Diagnosis not present

## 2018-12-18 DIAGNOSIS — Z794 Long term (current) use of insulin: Secondary | ICD-10-CM | POA: Insufficient documentation

## 2018-12-18 DIAGNOSIS — M542 Cervicalgia: Secondary | ICD-10-CM | POA: Diagnosis not present

## 2018-12-18 DIAGNOSIS — M62838 Other muscle spasm: Secondary | ICD-10-CM

## 2018-12-18 DIAGNOSIS — Z79899 Other long term (current) drug therapy: Secondary | ICD-10-CM | POA: Insufficient documentation

## 2018-12-18 DIAGNOSIS — R079 Chest pain, unspecified: Secondary | ICD-10-CM | POA: Diagnosis not present

## 2018-12-18 DIAGNOSIS — D649 Anemia, unspecified: Secondary | ICD-10-CM

## 2018-12-18 DIAGNOSIS — E119 Type 2 diabetes mellitus without complications: Secondary | ICD-10-CM | POA: Insufficient documentation

## 2018-12-18 DIAGNOSIS — S299XXA Unspecified injury of thorax, initial encounter: Secondary | ICD-10-CM | POA: Diagnosis not present

## 2018-12-18 MED ORDER — CYCLOBENZAPRINE HCL 10 MG PO TABS: 10.0000 mg | ORAL_TABLET | Freq: Two times a day (BID) | ORAL | 0 refills | Status: DC | PRN
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
## 2018-12-18 NOTE — ED Notes (Signed)
Patient transported to X-ray 
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
## 2018-12-19 ENCOUNTER — Inpatient Hospital Stay: Payer: BC Managed Care – PPO | Admitting: Family

## 2018-12-19 ENCOUNTER — Inpatient Hospital Stay: Payer: BC Managed Care – PPO | Attending: Family
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
## 2018-12-24 ENCOUNTER — Ambulatory Visit: Payer: BC Managed Care – PPO | Admitting: Family Medicine

## 2018-12-24 ENCOUNTER — Encounter: Payer: Self-pay | Admitting: Family Medicine

## 2018-12-24 ENCOUNTER — Ambulatory Visit (INDEPENDENT_AMBULATORY_CARE_PROVIDER_SITE_OTHER): Payer: BC Managed Care – PPO

## 2018-12-24 ENCOUNTER — Other Ambulatory Visit: Payer: Self-pay

## 2018-12-24 VITALS — BP 128/80 | Ht 71.0 in | Wt 274.0 lb

## 2018-12-24 DIAGNOSIS — S199XXA Unspecified injury of neck, initial encounter: Secondary | ICD-10-CM | POA: Diagnosis not present

## 2018-12-24 DIAGNOSIS — D500 Iron deficiency anemia secondary to blood loss (chronic): Secondary | ICD-10-CM

## 2018-12-24 DIAGNOSIS — S161XXA Strain of muscle, fascia and tendon at neck level, initial encounter: Secondary | ICD-10-CM | POA: Insufficient documentation

## 2018-12-24 MED ORDER — METHOCARBAMOL 500 MG PO TABS: 500.0000 mg | ORAL_TABLET | Freq: Three times a day (TID) | ORAL | 0 refills | Status: DC | PRN
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
## 2018-12-25 ENCOUNTER — Ambulatory Visit: Payer: Self-pay | Admitting: Women's Health

## 2019-01-04 ENCOUNTER — Other Ambulatory Visit: Payer: Self-pay

## 2019-01-04 DIAGNOSIS — Z20822 Contact with and (suspected) exposure to covid-19: Secondary | ICD-10-CM

## 2019-01-04 DIAGNOSIS — Z20828 Contact with and (suspected) exposure to other viral communicable diseases: Secondary | ICD-10-CM | POA: Diagnosis not present

## 2019-01-06 LAB — NOVEL CORONAVIRUS, NAA: SARS-CoV-2, NAA: NOT DETECTED

## 2019-01-10 ENCOUNTER — Encounter: Payer: Self-pay | Admitting: Family Medicine

## 2019-01-10 ENCOUNTER — Ambulatory Visit (INDEPENDENT_AMBULATORY_CARE_PROVIDER_SITE_OTHER): Payer: BC Managed Care – PPO | Admitting: Family Medicine

## 2019-01-10 DIAGNOSIS — G43009 Migraine without aura, not intractable, without status migrainosus: Secondary | ICD-10-CM

## 2019-01-10 MED ORDER — TOPIRAMATE 25 MG PO TABS: 25.0000 mg | ORAL_TABLET | Freq: Two times a day (BID) | ORAL | 1 refills | Status: DC
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
## 2019-01-11 ENCOUNTER — Ambulatory Visit: Payer: BC Managed Care – PPO | Admitting: Family Medicine

## 2019-01-23 ENCOUNTER — Ambulatory Visit: Payer: Self-pay | Admitting: Women's Health

## 2019-01-23 ENCOUNTER — Other Ambulatory Visit: Payer: Self-pay | Admitting: Family Medicine

## 2019-01-23 DIAGNOSIS — G43009 Migraine without aura, not intractable, without status migrainosus: Secondary | ICD-10-CM

## 2019-01-28 ENCOUNTER — Encounter: Payer: Self-pay | Admitting: Family Medicine

## 2019-02-06 ENCOUNTER — Other Ambulatory Visit: Payer: Self-pay | Admitting: Family Medicine

## 2019-02-19 ENCOUNTER — Other Ambulatory Visit: Payer: Self-pay

## 2019-02-19 DIAGNOSIS — Z20822 Contact with and (suspected) exposure to covid-19: Secondary | ICD-10-CM

## 2019-02-21 LAB — NOVEL CORONAVIRUS, NAA: SARS-CoV-2, NAA: NOT DETECTED

## 2019-02-22 ENCOUNTER — Encounter: Payer: Self-pay | Admitting: Family Medicine

## 2019-02-22 ENCOUNTER — Telehealth (INDEPENDENT_AMBULATORY_CARE_PROVIDER_SITE_OTHER): Payer: BC Managed Care – PPO | Admitting: Family Medicine

## 2019-02-22 ENCOUNTER — Ambulatory Visit: Payer: BC Managed Care – PPO | Admitting: Family Medicine

## 2019-02-22 DIAGNOSIS — R0981 Nasal congestion: Secondary | ICD-10-CM

## 2019-02-22 DIAGNOSIS — Z20822 Contact with and (suspected) exposure to covid-19: Secondary | ICD-10-CM

## 2019-02-22 DIAGNOSIS — Z03818 Encounter for observation for suspected exposure to other biological agents ruled out: Secondary | ICD-10-CM | POA: Diagnosis not present

## 2019-02-22 NOTE — Assessment & Plan Note (Signed)
Resolved. Negative covid test recently.  Cleared to return to work.

## 2019-02-22 NOTE — Progress Notes (Signed)
Dawn Shields - 33 y.o. female MRN [id number]  Date of birth: 1985-05-28   This visit type was conducted due to national recommendations for restrictions regarding the COVID-19 Pandemic (e.g. social distancing).  This format is felt to be most appropriate for this patient at this time.  All issues noted in this document were discussed and addressed.  No physical exam was performed (except for noted visual exam findings with Video Visits).  I discussed the limitations of evaluation and management by telemedicine and the availability of in person appointments. The patient expressed understanding and agreed to proceed.  I connected with@ on 02/22/19 at 10:00 AM EST by a video enabled telemedicine application and verified that I am speaking with the correct person using two identifiers.  Present at visit: Luetta Nutting, DO Damaris Hippo   Patient Location: Home [place] [address]   Provider location:   [place]  Chief Complaint  Patient presents with  . Work Note    Pt agrees to virtual visit.  She is needing a note to RTN to work after testing Negative for COVID. She must return to work on Monday 12.7.20 and needs to be able to print it from her [place]. She is asymptomatic.    HPI  Dawn Shields is a 33 y.o. female who presents via audio/video conferencing for a telehealth visit today.  She reports that she was required to have COVID test by her employer earlier this week due to having some mild congestion.  She said that congestion has resolved and symptoms never worsened and she has not developed any new symptoms including chest pain, shortness of breath, fever or chills.  She has no known exposure and had negative COVID test on 02/19/2019.    ROS:  A comprehensive ROS was completed and negative except as noted per HPI  Past Medical History:  Diagnosis Date  . Diabetes mellitus without complication ([place])   . Migraine     No past  surgical history on file.  Family History  Problem Relation Age of Onset  . Diabetes Mother   . Heart disease Mother     Social History   Socioeconomic History  . Marital status: Single    Spouse name: Not on file  . Number of children: Not on file  . Years of education: Not on file  . Highest education level: Not on file  Occupational History  . Not on file  Social Needs  . Financial resource strain: Not on file  . Food insecurity    Worry: Not on file    Inability: Not on file  . Transportation needs    Medical: Not on file    Non-medical: Not on file  Tobacco Use  . Smoking status: Never Smoker  . Smokeless tobacco: Never Used  Substance and Sexual Activity  . Alcohol use: No  . Drug use: No  . Sexual activity: Not on file  Lifestyle  . Physical activity    Days per week: Not on file    Minutes per session: Not on file  . Stress: Not on file  Relationships  . Social Herbalist on phone: Not on file    Gets together: Not on file    Attends religious service: Not on file    Active member of club or organization: Not on file    Attends meetings of clubs or organizations: Not on file    Relationship status: Not  on file  . Intimate partner violence    Fear of current or ex partner: Not on file    Emotionally abused: Not on file    Physically abused: Not on file    Forced sexual activity: Not on file  Other Topics Concern  . Not on file  Social History Narrative  . Not on file     Current Outpatient Medications:  .  Bayer Microlet Lancets lancets, Use to test blood sugars 1-2 times daily. (Patient taking differently: 1 each by Other route daily. Use to test blood sugars daily.), Disp: 100 each, Rfl: 12 .  Blood Glucose Monitoring Suppl (CONTOUR NEXT MONITOR) w/Device KIT, 1 each by Does not apply route daily., Disp: 1 kit, Rfl: 0 .  cyclobenzaprine (FLEXERIL) 10 MG tablet, Take 1 tablet (10 mg total) by mouth 2 (two) times daily as needed for muscle  spasms., Disp: 20 tablet, Rfl: 0 .  ferrous sulfate 325 (65 FE) MG tablet, Take 1 tablet (325 mg total) by mouth 2 (two) times daily with a meal., Disp: 180 tablet, Rfl: 1 .  glucose blood (CONTOUR NEXT TEST) test strip, Use to test blood sugars one-two times daily., Disp: 100 each, Rfl: 12 .  metFORMIN (GLUCOPHAGE) 500 MG tablet, Take 1 tablet (500 mg total) by mouth 2 (two) times daily with a meal., Disp: 60 tablet, Rfl: 5 .  methocarbamol (ROBAXIN) 500 MG tablet, Take 1 tablet (500 mg total) by mouth every 8 (eight) hours as needed for muscle spasms., Disp: 50 tablet, Rfl: 0 .  saxagliptin HCl (ONGLYZA) 5 MG TABS tablet, Take 1 tablet (5 mg total) by mouth daily., Disp: 30 tablet, Rfl: 11 .  topiramate (TOPAMAX) 25 MG tablet, TAKE 1 TABLET BY MOUTH TWICE A DAY, Disp: 180 tablet, Rfl: 1 .  LEVEMIR FLEXTOUCH 100 UNIT/ML Pen, INJECT 15 UNITS UNDER SKIN AT BEDTIME. MAY INCREASE BY 4 UNITS EVERY 3 DAYS UNTIL FASTING SUGARS ARE <140 BUT >90 (Patient not taking: Reported on 02/22/2019), Disp: 15 mL, Rfl: 2  EXAM:  VITALS per patient if applicable: There were no vitals taken for this visit.  GENERAL: alert, oriented, appears well and in no acute distress  HEENT: atraumatic, conjunttiva clear, no obvious abnormalities on inspection of external nose and ears  NECK: normal movements of the head and neck  LUNGS: on inspection no signs of respiratory distress, breathing rate appears normal, no obvious gross SOB, gasping or wheezing  CV: no obvious cyanosis  MS: moves all visible extremities without noticeable abnormality  PSYCH/NEURO: pleasant and cooperative, no obvious depression or anxiety, speech and thought processing grossly intact  ASSESSMENT AND PLAN:  Discussed the following assessment and plan:  Nasal congestion Resolved. Negative covid test recently.  Cleared to return to work.      I discussed the assessment and treatment plan with the patient. The patient was provided an  opportunity to ask questions and all were answered. The patient agreed with the plan and demonstrated an understanding of the instructions.   The patient was advised to call back or seek an in-person evaluation if the symptoms worsen or if the condition fails to improve as anticipated.    Luetta Nutting, DO

## 2019-02-26 ENCOUNTER — Ambulatory Visit: Payer: BC Managed Care – PPO | Admitting: Family Medicine

## 2019-03-06 ENCOUNTER — Ambulatory Visit: Payer: Self-pay | Admitting: Women's Health

## 2019-04-05 ENCOUNTER — Other Ambulatory Visit: Payer: Self-pay | Admitting: Family Medicine

## 2019-04-15 ENCOUNTER — Ambulatory Visit: Payer: Self-pay | Admitting: Women's Health

## 2019-04-18 ENCOUNTER — Encounter (HOSPITAL_COMMUNITY): Payer: Self-pay

## 2019-04-18 ENCOUNTER — Emergency Department (HOSPITAL_COMMUNITY): Payer: BC Managed Care – PPO

## 2019-04-18 ENCOUNTER — Other Ambulatory Visit: Payer: Self-pay

## 2019-04-18 ENCOUNTER — Emergency Department (HOSPITAL_COMMUNITY)
Admission: EM | Admit: 2019-04-18 | Discharge: 2019-04-18 | Disposition: A | Discharge status: Home / Self Care | Payer: BC Managed Care – PPO | Attending: Emergency Medicine | Admitting: Emergency Medicine

## 2019-04-18 DIAGNOSIS — Z7984 Long term (current) use of oral hypoglycemic drugs: Secondary | ICD-10-CM | POA: Diagnosis not present

## 2019-04-18 DIAGNOSIS — Y9389 Activity, other specified: Secondary | ICD-10-CM | POA: Diagnosis not present

## 2019-04-18 DIAGNOSIS — M545 Low back pain: Secondary | ICD-10-CM | POA: Insufficient documentation

## 2019-04-18 DIAGNOSIS — E119 Type 2 diabetes mellitus without complications: Secondary | ICD-10-CM | POA: Diagnosis not present

## 2019-04-18 DIAGNOSIS — Y92410 Unspecified street and highway as the place of occurrence of the external cause: Secondary | ICD-10-CM | POA: Diagnosis not present

## 2019-04-18 DIAGNOSIS — M533 Sacrococcygeal disorders, not elsewhere classified: Secondary | ICD-10-CM | POA: Insufficient documentation

## 2019-04-18 DIAGNOSIS — Y999 Unspecified external cause status: Secondary | ICD-10-CM | POA: Diagnosis not present

## 2019-04-18 DIAGNOSIS — M25561 Pain in right knee: Secondary | ICD-10-CM | POA: Diagnosis not present

## 2019-04-18 LAB — POC URINE PREG, ED: Preg Test, Ur: NEGATIVE

## 2019-04-18 MED ORDER — HYDROCODONE-ACETAMINOPHEN 5-325 MG PO TABS: 1.0000 | ORAL_TABLET | Freq: Four times a day (QID) | ORAL | 0 refills | Status: DC | PRN

## 2019-04-18 MED ORDER — LIDOCAINE 5 % EX PTCH
1.0000 | MEDICATED_PATCH | Freq: Once | CUTANEOUS | Status: DC
  Administered 2019-04-18: 1 via TRANSDERMAL
  Filled 2019-04-18: qty 1

## 2019-04-18 MED ORDER — CYCLOBENZAPRINE HCL 10 MG PO TABS: 10.0000 mg | ORAL_TABLET | Freq: Two times a day (BID) | ORAL | 0 refills | Status: DC | PRN

## 2019-04-18 MED ORDER — OXYCODONE-ACETAMINOPHEN 5-325 MG PO TABS
1.0000 | ORAL_TABLET | Freq: Once | ORAL | Status: DC
  Filled 2019-04-18: qty 1

## 2019-04-18 MED ORDER — ONDANSETRON HCL 4 MG PO TABS
4.0000 mg | ORAL_TABLET | Freq: Once | ORAL | Status: DC
  Filled 2019-04-18: qty 1

## 2019-04-18 MED ORDER — NAPROXEN 375 MG PO TABS: 375.0000 mg | ORAL_TABLET | Freq: Two times a day (BID) | ORAL | 0 refills | Status: AC | PRN
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
## 2019-04-18 NOTE — ED Notes (Signed)
ED Provider at bedside. 

## 2019-04-19 ENCOUNTER — Other Ambulatory Visit: Payer: Self-pay | Admitting: Family Medicine

## 2019-04-22 ENCOUNTER — Other Ambulatory Visit: Payer: Self-pay

## 2019-04-22 ENCOUNTER — Telehealth: Payer: Self-pay | Admitting: Endocrinology

## 2019-04-22 NOTE — Telephone Encounter (Signed)
Patient called stating that her insurance no longer covers saxagliptin HCl (ONGLYZA) 5 MG TABS tablet and they recommend an alternative to this RX. Patient states she it out of RX. She would like to be reached at [phone].  Pharmacy :  [id number] [place], [place] - [address] Phone:  [phone]  Fax:  [phone]

## 2019-04-22 NOTE — Telephone Encounter (Signed)
Per Dr. Everardo All, unable to address pt concern without an appt. Per Dr. George Hugh request, please call pt to schedule an appt. Will address concern during her appt

## 2019-04-25 ENCOUNTER — Telehealth: Payer: Self-pay

## 2019-04-25 ENCOUNTER — Ambulatory Visit: Payer: Self-pay | Admitting: Endocrinology

## 2019-04-25 ENCOUNTER — Other Ambulatory Visit: Payer: Self-pay

## 2019-04-25 NOTE — Telephone Encounter (Signed)
LOV 10/04/18. Per Dr. George Hugh not, pt was to f/u in 1 month. A 30 day supply was sent to pt pharmacy for Onglyza so long as pt kept her appt. Correspondence was received from [facility] indicating Onglyza no longer covered. Provided Dr. Everardo All with correspondence. Advised to call pt to ask her to inquire with insurance re: covered alternative. Called pt to make her aware. States she will not be keeping her appt for tomorrow. Further added she will call insurance and will call back to with covered alternative. Redirected pt that an appt is still required as Dr. Everardo All was only providing 30 day supply until he saw her tomorrow. Pt again declined to keep appt, stating "please let him know I will not keep appt. He will send in Rx for what medication is covered." Advised I will send this message to Dr. Everardo All for him to be aware of her response. Verbalized acceptance and understanding.  At this time, PA will not be completed for NON-COVERED medication. Will continue to await alternative AND Dr. George Hugh response re: pt declining to keep appt.

## 2019-04-26 ENCOUNTER — Ambulatory Visit: Payer: Self-pay | Admitting: Endocrinology

## 2019-04-26 ENCOUNTER — Encounter: Payer: Self-pay | Admitting: Family Medicine

## 2019-04-26 ENCOUNTER — Ambulatory Visit (INDEPENDENT_AMBULATORY_CARE_PROVIDER_SITE_OTHER): Payer: BC Managed Care – PPO | Admitting: Family Medicine

## 2019-04-26 VITALS — BP 116/72 | HR 94 | Temp 98.1°F | Ht 71.0 in | Wt 276.2 lb

## 2019-04-26 DIAGNOSIS — S86911S Strain of unspecified muscle(s) and tendon(s) at lower leg level, right leg, sequela: Secondary | ICD-10-CM | POA: Diagnosis not present

## 2019-04-26 DIAGNOSIS — S86911A Strain of unspecified muscle(s) and tendon(s) at lower leg level, right leg, initial encounter: Secondary | ICD-10-CM | POA: Insufficient documentation

## 2019-04-26 DIAGNOSIS — S39012A Strain of muscle, fascia and tendon of lower back, initial encounter: Secondary | ICD-10-CM | POA: Insufficient documentation

## 2019-04-26 DIAGNOSIS — S39012S Strain of muscle, fascia and tendon of lower back, sequela: Secondary | ICD-10-CM | POA: Diagnosis not present

## 2019-04-26 MED ORDER — METHOCARBAMOL 500 MG PO TABS: 500.0000 mg | ORAL_TABLET | Freq: Three times a day (TID) | ORAL | 0 refills | Status: DC | PRN

## 2019-04-26 MED ORDER — MELOXICAM 7.5 MG PO TABS: 7.5000 mg | ORAL_TABLET | Freq: Every day | ORAL | 0 refills | Status: DC

## 2019-04-26 NOTE — Telephone Encounter (Signed)
Ov needed in order to do PA

## 2019-04-26 NOTE — Telephone Encounter (Signed)
Before calling this pt, will you agree to change Rx to covered alternative IF she schedules f/u appt?

## 2019-04-26 NOTE — Telephone Encounter (Signed)
yes

## 2019-04-26 NOTE — Telephone Encounter (Signed)
Patient calling today to have Januvia sent in place of the Onglyza due to insurance-she would like [facility] to [facility] [address]-she will be in for her appointment today if discussion is needed-please advise
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
## 2019-04-26 NOTE — Telephone Encounter (Signed)
Please advise 

## 2019-04-29 ENCOUNTER — Telehealth: Payer: Self-pay | Admitting: Endocrinology

## 2019-04-29 NOTE — Telephone Encounter (Signed)
Duplicate note

## 2019-04-29 NOTE — Telephone Encounter (Signed)
Please let me know when pt schedules appt

## 2019-04-29 NOTE — Telephone Encounter (Signed)
Before calling this pt, will you agree to change Rx to covered alternative IF she schedules f/u appt?  YES

## 2019-04-29 NOTE — Telephone Encounter (Signed)
Januvia - what mg? And what frequency?  Please provide above

## 2019-04-29 NOTE — Telephone Encounter (Signed)
FYI

## 2019-04-29 NOTE — Telephone Encounter (Signed)
Pt canceled appt, so we can't send rx

## 2019-04-29 NOTE — Telephone Encounter (Signed)
Please advise about dosage. According to your response below, we were sending 30 day supply IF she scheduled appt. She has not yet been called to re-schedule appt since we are awaiting your response about the dosage:  Romero Belling, MD 3 days ago     yes      Documentation   You  Romero Belling, MD 3 days ago     Before calling this pt, will you agree to change Rx to covered alternative IF she schedules f/u appt?

## 2019-04-29 NOTE — Telephone Encounter (Signed)
Please advise what dose to send for Januvia.

## 2019-04-29 NOTE — Telephone Encounter (Signed)
Please call pt

## 2019-04-29 NOTE — Telephone Encounter (Signed)
Patient is requesting to have Januvia instead of Onglyza due to new insurance coverage. Patient [phone]  Pharmacy :  [id number] [place], [place] - [address] Phone:  [phone]  Fax:  [phone]

## 2019-04-30 NOTE — Telephone Encounter (Signed)
Called pt per Dr. George Hugh request. LVM advising pt that she MUST schedule an appt BEFORE a Rx for Januvia can be sent (refer to communications below). Once appt has been scheduled, Dr. Everardo All has asked that he be informed so he can send in a new Rx for Januvia. Rx remains pending until pt returns call to schedule appt.

## 2019-05-01 ENCOUNTER — Ambulatory Visit: Payer: Self-pay | Admitting: Endocrinology

## 2019-05-10 ENCOUNTER — Telehealth: Payer: Self-pay | Admitting: Endocrinology

## 2019-05-10 ENCOUNTER — Other Ambulatory Visit: Payer: Self-pay

## 2019-05-10 DIAGNOSIS — E0865 Diabetes mellitus due to underlying condition with hyperglycemia: Secondary | ICD-10-CM

## 2019-05-10 MED ORDER — LEVEMIR FLEXTOUCH 100 UNIT/ML ~~LOC~~ SOPN: 15.0000 [IU] | PEN_INJECTOR | Freq: Every day | SUBCUTANEOUS | 0 refills | Status: DC

## 2019-05-10 NOTE — Telephone Encounter (Signed)
Please advise. Pt has repeatedly canceled on 04/25/19, 04/26/19 and 05/01/19. Pattern indicates she may likely cancel 2/22 appt as well. Please also refer to previous encounters with regards to refill requests.

## 2019-05-10 NOTE — Telephone Encounter (Signed)
Patient has appointment Monday 05/13/2019 but until then needs an [facility] [address].  Has been out of insulin for 2 weeks, feeling lightheaded, dizzy, vision is being affected blood sugars 160-190 or higher  Please call patient at [phone]

## 2019-05-10 NOTE — Telephone Encounter (Signed)
Then please refill x 1, pending appt.

## 2019-05-10 NOTE — Telephone Encounter (Signed)
We'll d/c pt from practice

## 2019-05-10 NOTE — Telephone Encounter (Signed)
Should pt again cancel, how would you like to proceed?

## 2019-05-10 NOTE — Telephone Encounter (Signed)
Outpatient Medication Detail   Disp Refills Start End   Insulin Detemir (LEVEMIR FLEXTOUCH) 100 UNIT/ML Pen 4.5 mL 0 05/10/2019    Sig - Route: Inject 15 Units into the skin at bedtime. OVERDUE FOR AN APPT. WILL ONLY PROVIDE 30 DAY SUPPLY. MUST KEEP APPT 05/13/19 - Subcutaneous   Sent to pharmacy as: Insulin Detemir (LEVEMIR FLEXTOUCH) 100 UNIT/ML Pen   E-Prescribing Status: Receipt confirmed by pharmacy (05/10/2019  3:47 PM EST)    Above sent per orders of Dr. Everardo All.

## 2019-05-13 ENCOUNTER — Other Ambulatory Visit: Payer: Self-pay

## 2019-05-13 ENCOUNTER — Encounter: Payer: Self-pay | Admitting: Endocrinology

## 2019-05-13 ENCOUNTER — Ambulatory Visit: Payer: BC Managed Care – PPO | Admitting: Endocrinology

## 2019-05-13 VITALS — BP 118/60 | HR 100 | Ht 71.0 in | Wt 278.0 lb

## 2019-05-13 DIAGNOSIS — E0865 Diabetes mellitus due to underlying condition with hyperglycemia: Secondary | ICD-10-CM

## 2019-05-13 DIAGNOSIS — E119 Type 2 diabetes mellitus without complications: Secondary | ICD-10-CM | POA: Diagnosis not present

## 2019-05-13 LAB — POCT GLYCOSYLATED HEMOGLOBIN (HGB A1C): Hemoglobin A1C: 7.3 % — AB (ref 4.0–5.6)

## 2019-05-13 MED ORDER — SITAGLIPTIN PHOSPHATE 100 MG PO TABS: 100.0000 mg | ORAL_TABLET | Freq: Every day | ORAL | 3 refills | Status: DC

## 2019-05-13 MED ORDER — METFORMIN HCL ER 500 MG PO TB24: 1000.0000 mg | ORAL_TABLET | Freq: Two times a day (BID) | ORAL | 3 refills | Status: DC

## 2019-05-13 NOTE — Progress Notes (Signed)
Subjective:    Patient ID: Dawn Shields, female    DOB: January 02, 1986, 34 y.o.   MRN: [id number]  HPI Pt returns for f/u of diabetes mellitus: DM type: Insulin-requiring type 2 Dx'ed: 4818 Complications: none Therapy: 2 oral meds GDM: G0 DKA: never Severe hypoglycemia: never Pancreatitis: never Pancreatic imaging: never SDOH: She works 5 PM-3 AM Other: she took insulin for a brief time in 2020 Interval history: Ins prefers Januvia over [employer].  She took insulin only x 3 days, since she ran out of [place].  no cbg record, but states cbg's are in the mid-100's.  pt states she feels well in general.   Past Medical History:  Diagnosis Date  . Diabetes mellitus without complication ([place])   . Migraine     No past surgical history on file.  Social History   Socioeconomic History  . Marital status: Single    Spouse name: Not on file  . Number of children: Not on file  . Years of education: Not on file  . Highest education level: Not on file  Occupational History  . Not on file  Tobacco Use  . Smoking status: Never Smoker  . Smokeless tobacco: Never Used  Substance and Sexual Activity  . Alcohol use: No  . Drug use: No  . Sexual activity: Not Currently    Comment: virgin  Other Topics Concern  . Not on file  Social History Narrative  . Not on file   Social Determinants of Health   Financial Resource Strain:   . Difficulty of Paying Living Expenses: Not on file  Food Insecurity:   . Worried About Charity fundraiser in the Last Year: Not on file  . Ran Out of Food in the Last Year: Not on file  Transportation Needs:   . Lack of Transportation (Medical): Not on file  . Lack of Transportation (Non-Medical): Not on file  Physical Activity:   . Days of Exercise per Week: Not on file  . Minutes of Exercise per Session: Not on file  Stress:   . Feeling of Stress : Not on file  Social Connections:   . Frequency of Communication with Friends and Family: Not on file    . Frequency of Social Gatherings with Friends and Family: Not on file  . Attends Religious Services: Not on file  . Active Member of Clubs or Organizations: Not on file  . Attends Archivist Meetings: Not on file  . Marital Status: Not on file  Intimate Partner Violence:   . Fear of Current or Ex-Partner: Not on file  . Emotionally Abused: Not on file  . Physically Abused: Not on file  . Sexually Abused: Not on file    Current Outpatient Medications on File Prior to Visit  Medication Sig Dispense Refill  . Bayer Microlet Lancets lancets Use to test blood sugars 1-2 times daily. 100 each 12  . Blood Glucose Monitoring Suppl (CONTOUR NEXT MONITOR) w/Device KIT 1 each by Does not apply route daily. 1 kit 0  . cyclobenzaprine (FLEXERIL) 10 MG tablet Take 1 tablet (10 mg total) by mouth 2 (two) times daily as needed for muscle spasms. 10 tablet 0  . ferrous sulfate 325 (65 FE) MG tablet Take 1 tablet (325 mg total) by mouth 2 (two) times daily with a meal. 180 tablet 1  . glucose blood (CONTOUR NEXT TEST) test strip Use to test blood sugars one-two times daily. 100 each 12  . HYDROcodone-acetaminophen (NORCO/VICODIN)  5-325 MG tablet Take 1 tablet by mouth every 6 (six) hours as needed for severe pain. 4 tablet 0  . meloxicam (MOBIC) 7.5 MG tablet Take 1 tablet (7.5 mg total) by mouth daily. 30 tablet 0  . methocarbamol (ROBAXIN) 500 MG tablet Take 1 tablet (500 mg total) by mouth every 8 (eight) hours as needed for muscle spasms. 40 tablet 0  . topiramate (TOPAMAX) 25 MG tablet TAKE 1 TABLET BY MOUTH TWICE A DAY 180 tablet 1   No current facility-administered medications on file prior to visit.    Allergies  Allergen Reactions  . Erythromycin Rash    Family History  Problem Relation Age of Onset  . Diabetes Mother   . Heart disease Mother     BP 118/60   Pulse 100   Ht 5' 11"  (1.803 m)   Wt 278 lb (126.1 kg)   LMP 05/01/2019   SpO2 98%   BMI 38.77 kg/m   Review  of Systems Denies n/v/d.     Objective:   Physical Exam VITAL SIGNS:  See vs page GENERAL: no distress Pulses: dorsalis pedis intact bilat.   MSK: no deformity of the feet CV: no leg edema Skin:  no ulcer on the feet.  normal color and temp on the feet.  Neuro: sensation is intact to touch on the feet.    Lab Results  Component Value Date   HGBA1C 7.3 (A) 05/13/2019       Assessment & Plan:  type 2 DM: she needs increased rx   Patient Instructions  I have sent 2 prescription to your pharmacy: to double the metformin, and to change Onglyza to [place]. You can stay off the insulin. check your blood sugar once a day.  vary the time of day when you check, between before the 3 meals, and at bedtime.  also check if you have symptoms of your blood sugar being too high or too low.  please keep a record of the readings and bring it to your next appointment here (or you can bring the meter itself).  You can write it on any piece of paper.  please call us sooner if your blood sugar goes below 70, or if you have a lot of readings over 200. Please come back for a follow-up appointment in 2 months.  Our goals are to get the A1c below 6.5%, with insulin.

## 2019-05-13 NOTE — Patient Instructions (Signed)
I have sent 2 prescription to your pharmacy: to double the metformin, and to change Onglyza to Januvia. You can stay off the insulin. check your blood sugar once a day.  vary the time of day when you check, between before the 3 meals, and at bedtime.  also check if you have symptoms of your blood sugar being too high or too low.  please keep a record of the readings and bring it to your next appointment here (or you can bring the meter itself).  You can write it on any piece of paper.  please call us sooner if your blood sugar goes below 70, or if you have a lot of readings over 200. Please come back for a follow-up appointment in 2 months.  Our goals are to get the A1c below 6.5%, with insulin.

## 2019-05-14 ENCOUNTER — Other Ambulatory Visit: Payer: Self-pay

## 2019-05-15 ENCOUNTER — Ambulatory Visit: Payer: BC Managed Care – PPO | Admitting: Women's Health

## 2019-05-15 ENCOUNTER — Encounter: Payer: Self-pay | Admitting: Women's Health

## 2019-05-15 VITALS — BP 120/78 | Ht 71.0 in | Wt 274.0 lb

## 2019-05-15 DIAGNOSIS — Z01419 Encounter for gynecological examination (general) (routine) without abnormal findings: Secondary | ICD-10-CM

## 2019-05-15 NOTE — Patient Instructions (Addendum)
Vit d 1000 iu daily Health Maintenance, Female Adopting a healthy lifestyle and getting preventive care are important in promoting health and wellness. Ask your health care provider about:  The right schedule for you to have regular tests and exams.  Things you can do on your own to prevent diseases and keep yourself healthy. What should I know about diet, weight, and exercise? Eat a healthy diet   Eat a diet that includes plenty of vegetables, fruits, low-fat dairy products, and lean protein.  Do not eat a lot of foods that are high in solid fats, added sugars, or sodium. Maintain a healthy weight Body mass index (BMI) is used to identify weight problems. It estimates body fat based on height and weight. Your health care provider can help determine your BMI and help you achieve or maintain a healthy weight. Get regular exercise Get regular exercise. This is one of the most important things you can do for your health. Most adults should:  Exercise for at least 150 minutes each week. The exercise should increase your heart rate and make you sweat (moderate-intensity exercise).  Do strengthening exercises at least twice a week. This is in addition to the moderate-intensity exercise.  Spend less time sitting. Even light physical activity can be beneficial. Watch cholesterol and blood lipids Have your blood tested for lipids and cholesterol at 35 years of age, then have this test every 5 years. Have your cholesterol levels checked more often if:  Your lipid or cholesterol levels are high.  You are older than 35 years of age.  You are at high risk for heart disease. What should I know about cancer screening? Depending on your health history and family history, you may need to have cancer screening at various ages. This may include screening for:  Breast cancer.  Cervical cancer.  Colorectal cancer.  Skin cancer.  Lung cancer. What should I know about heart disease, diabetes,  and high blood pressure? Blood pressure and heart disease  High blood pressure causes heart disease and increases the risk of stroke. This is more likely to develop in people who have high blood pressure readings, are of African descent, or are overweight.  Have your blood pressure checked: ? Every 3-5 years if you are 88-58 years of age. ? Every year if you are 78 years old or older. Diabetes Have regular diabetes screenings. This checks your fasting blood sugar level. Have the screening done:  Once every three years after age 5 if you are at a normal weight and have a low risk for diabetes.  More often and at a younger age if you are overweight or have a high risk for diabetes. What should I know about preventing infection? Hepatitis B If you have a higher risk for hepatitis B, you should be screened for this virus. Talk with your health care provider to find out if you are at risk for hepatitis B infection. Hepatitis C Testing is recommended for:  Everyone born from [birth year] through 1966.  Anyone with known risk factors for hepatitis C. Sexually transmitted infections (STIs)  Get screened for STIs, including gonorrhea and chlamydia, if: ? You are sexually active and are younger than 35 years of age. ? You are older than 35 years of age and your health care provider tells you that you are at risk for this type of infection. ? Your sexual activity has changed since you were last screened, and you are at increased risk for chlamydia or gonorrhea. Ask  gonorrhea. Ask your health care provider if you are at risk.  Ask your health care provider about whether you are at high risk for HIV. Your health care provider may recommend a prescription medicine to help prevent HIV infection. If you choose to take medicine to prevent HIV, you should first get tested for HIV. You should then be tested every 3 months for as long as you are taking the medicine. Pregnancy  If you are about to stop having your  period (premenopausal) and you may become pregnant, seek counseling before you get pregnant.  Take 400 to 800 micrograms (mcg) of folic acid every day if you become pregnant.  Ask for birth control (contraception) if you want to prevent pregnancy. Osteoporosis and menopause Osteoporosis is a disease in which the bones lose minerals and strength with aging. This can result in bone fractures. If you are 65 years old or older, or if you are at risk for osteoporosis and fractures, ask your health care provider if you should:  Be screened for bone loss.  Take a calcium or vitamin D supplement to lower your risk of fractures.  Be given hormone replacement therapy (HRT) to treat symptoms of menopause. Follow these instructions at home: Lifestyle  Do not use any products that contain nicotine or tobacco, such as cigarettes, e-cigarettes, and chewing tobacco. If you need help quitting, ask your health care provider.  Do not use street drugs.  Do not share needles.  Ask your health care provider for help if you need support or information about quitting drugs. Alcohol use  Do not drink alcohol if: ? Your health care provider tells you not to drink. ? You are pregnant, may be pregnant, or are planning to become pregnant.  If you drink alcohol: ? Limit how much you use to 0-1 drink a day. ? Limit intake if you are breastfeeding.  Be aware of how much alcohol is in your drink. In the [place] one drink equals one 12 oz bottle of beer (355 mL), one 5 oz glass of wine (148 mL), or one 1 oz glass of hard liquor (44 mL). General instructions  Schedule regular health, dental, and eye exams.  Stay current with your vaccines.  Tell your health care provider if: ? You often feel depressed. ? You have ever been abused or do not feel safe at home. Summary  Adopting a healthy lifestyle and getting preventive care are important in promoting health and wellness.  Follow your health care provider's  instructions about healthy diet, exercising, and getting tested or screened for diseases.  Follow your health care provider's instructions on monitoring your cholesterol and blood pressure. This information is not intended to replace advice given to you by your health care provider. Make sure you discuss any questions you have with your health care provider. Document Revised: 02/28/2018 Document Reviewed: 02/28/2018 Elsevier Patient Education  2020 Elsevier Inc.  

## 2019-05-15 NOTE — Addendum Note (Signed)
Addended by: Tito Dine on: 05/15/2019 03:47 PM   Modules accepted: Orders

## 2019-05-15 NOTE — Progress Notes (Signed)
Dawn Shields May 05, 1985 [id number]    History:    Presents for annual exam.  Regular monthly 6-day cycles first 3 days heavy flow, virgin.  Has not had a Pap.  Desires no children.  Diagnosed with diabetes last year weight is down 25 pounds with diet changes and exercise.  Past medical history, past surgical history, family history and social history were all reviewed and documented in the EPIC chart.  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  Sister committed suicide this past year.  Mother diabetes.  ROS:  A ROS was performed and pertinent positives and negatives are included.  Exam:  Vitals:   05/15/19 1413  BP: 120/78  Weight: 274 lb (124.3 kg)  Height: 5\' 11"  (1.803 m)   Body mass index is 38.22 kg/m.   General appearance:  Normal Thyroid:  Symmetrical, normal in size, without palpable masses or nodularity. Respiratory  Auscultation:  Clear without wheezing or rhonchi Cardiovascular  Auscultation:  Regular rate, without rubs, murmurs or gallops  Edema/varicosities:  Not grossly evident Abdominal  Soft,nontender, without masses, guarding or rebound.  Liver/spleen:  No organomegaly noted  Hernia:  None appreciated  Skin  Inspection:  Grossly normal   Breasts: Examined lying and sitting.     Right: Without masses, retractions, discharge or axillary adenopathy.     Left: Without masses, retractions, discharge or axillary adenopathy. Gentitourinary   Inguinal/mons:  Normal without inguinal adenopathy  External genitalia:  Normal  BUS/Urethra/Skene's glands:  Normal  Vagina:  Normal/virginal  Cervix:  Normal  Uterus:  normal in size, shape and contour.  Midline and mobile  Adnexa/parametria:     Rt: Without masses or tenderness.   Lt: Without masses or tenderness.  Anus and perineum: Normal    Assessment/Plan:  34 y.o. S BF Virgin for annual exam with no complaints of vaginal discharge, urinary symptoms, GI problems or abdominal  pain.  Monthly 6-day cycle first 3 days heavy flow  Diabetes-primary care manages labs and meds  Plan: Contraception options reviewed interested in Nexplanon if becomes sexually active.  Will call the office to check insurance coverage and have placed.  Aware it is good for 3 years may have some irregular bleeding.  Aware of need to decrease calorie/carbs.  SBEs, exercise, calcium rich foods, vitamin D 1000 IUs daily encouraged.  Pap with HR HPV typing,   [facility], 2:52 PM 05/15/2019

## 2019-05-15 NOTE — Addendum Note (Signed)
Addended by: Tito Dine on: 05/15/2019 03:53 PM   Modules accepted: Orders

## 2019-05-16 LAB — PAP, TP IMAGING W/ HPV RNA, RFLX HPV TYPE 16,18/45: HPV DNA High Risk: NOT DETECTED

## 2019-05-24 ENCOUNTER — Ambulatory Visit: Payer: BC Managed Care – PPO | Admitting: Family Medicine

## 2019-06-04 DIAGNOSIS — L700 Acne vulgaris: Secondary | ICD-10-CM | POA: Diagnosis not present

## 2019-06-04 DIAGNOSIS — L810 Postinflammatory hyperpigmentation: Secondary | ICD-10-CM | POA: Diagnosis not present

## 2019-06-11 ENCOUNTER — Telehealth: Payer: Self-pay | Admitting: *Deleted

## 2019-06-11 NOTE — Telephone Encounter (Signed)
Patient called c/o vaginal itching and irritation, used dial scented soap yesterday and last night noticed symptoms. Asked for recommendations to help with this? No odor or discharge. Please advise

## 2019-06-11 NOTE — Telephone Encounter (Signed)
Patient informed. 

## 2019-06-11 NOTE — Telephone Encounter (Signed)
May just be a mild irritation from the soap, could just try some over-the-counter A&D ointment apply twice daily and call if no relief.

## 2019-06-17 ENCOUNTER — Ambulatory Visit: Payer: BC Managed Care – PPO | Admitting: Family Medicine

## 2019-07-11 ENCOUNTER — Ambulatory Visit: Payer: BC Managed Care – PPO | Admitting: Endocrinology

## 2019-08-15 ENCOUNTER — Ambulatory Visit: Payer: BC Managed Care – PPO | Admitting: Endocrinology

## 2019-08-21 ENCOUNTER — Other Ambulatory Visit: Payer: Self-pay

## 2019-08-23 ENCOUNTER — Other Ambulatory Visit: Payer: Self-pay

## 2019-08-23 ENCOUNTER — Encounter: Payer: Self-pay | Admitting: Endocrinology

## 2019-08-23 ENCOUNTER — Ambulatory Visit: Payer: BC Managed Care – PPO | Admitting: Endocrinology

## 2019-08-23 VITALS — BP 130/80 | HR 90 | Ht 71.0 in | Wt 275.8 lb

## 2019-08-23 DIAGNOSIS — E0865 Diabetes mellitus due to underlying condition with hyperglycemia: Secondary | ICD-10-CM | POA: Diagnosis not present

## 2019-08-23 LAB — POCT GLYCOSYLATED HEMOGLOBIN (HGB A1C): Hemoglobin A1C: 6.2 % — AB (ref 4.0–5.6)

## 2019-08-23 NOTE — Progress Notes (Signed)
Subjective:    Patient ID: Dawn Shields, female    DOB: 03/08/1986, 34 y.o.   MRN: [id number]  HPI Pt returns for f/u of diabetes mellitus: DM type: 2 Dx'ed: 5027 Complications: none Therapy: 2 oral meds GDM: G0 DKA: never Severe hypoglycemia: never Pancreatitis: never Pancreatic imaging: never SDOH: She works 5 PM-3 AM Other: she took insulin for a brief time in 2020 Interval history: she takes meds as rx'ed.  She says cbg's are well-controlled.  pt states she feels well in general.  Past Medical History:  Diagnosis Date  . Diabetes mellitus without complication ([place])   . Migraine     No past surgical history on file.  Social History   Socioeconomic History  . Marital status: Single    Spouse name: Not on file  . Number of children: Not on file  . Years of education: Not on file  . Highest education level: Not on file  Occupational History  . Not on file  Tobacco Use  . Smoking status: Never Smoker  . Smokeless tobacco: Never Used  Substance and Sexual Activity  . Alcohol use: No  . Drug use: No  . Sexual activity: Not Currently    Comment: virgin  Other Topics Concern  . Not on file  Social History Narrative  . Not on file   Social Determinants of Health   Financial Resource Strain:   . Difficulty of Paying Living Expenses:   Food Insecurity:   . Worried About Charity fundraiser in the Last Year:   . Arboriculturist in the Last Year:   Transportation Needs:   . Film/video editor (Medical):   Marland Kitchen Lack of Transportation (Non-Medical):   Physical Activity:   . Days of Exercise per Week:   . Minutes of Exercise per Session:   Stress:   . Feeling of Stress :   Social Connections:   . Frequency of Communication with Friends and Family:   . Frequency of Social Gatherings with Friends and Family:   . Attends Religious Services:   . Active Member of Clubs or Organizations:   . Attends Archivist Meetings:   Marland Kitchen Marital Status:     Intimate Partner Violence:   . Fear of Current or Ex-Partner:   . Emotionally Abused:   Marland Kitchen Physically Abused:   . Sexually Abused:     Current Outpatient Medications on File Prior to Visit  Medication Sig Dispense Refill  . Bayer Microlet Lancets lancets Use to test blood sugars 1-2 times daily. 100 each 12  . Blood Glucose Monitoring Suppl (CONTOUR NEXT MONITOR) w/Device KIT 1 each by Does not apply route daily. 1 kit 0  . cyclobenzaprine (FLEXERIL) 10 MG tablet Take 1 tablet (10 mg total) by mouth 2 (two) times daily as needed for muscle spasms. 10 tablet 0  . ferrous sulfate 325 (65 FE) MG tablet Take 1 tablet (325 mg total) by mouth 2 (two) times daily with a meal. 180 tablet 1  . glucose blood (CONTOUR NEXT TEST) test strip Use to test blood sugars one-two times daily. 100 each 12  . HYDROcodone-acetaminophen (NORCO/VICODIN) 5-325 MG tablet Take 1 tablet by mouth every 6 (six) hours as needed for severe pain. 4 tablet 0  . meloxicam (MOBIC) 7.5 MG tablet Take 1 tablet (7.5 mg total) by mouth daily. 30 tablet 0  . metFORMIN (GLUCOPHAGE-XR) 500 MG 24 hr tablet Take 2 tablets (1,000 mg total) by mouth 2 (two)  times daily. 360 tablet 3  . methocarbamol (ROBAXIN) 500 MG tablet Take 1 tablet (500 mg total) by mouth every 8 (eight) hours as needed for muscle spasms. 40 tablet 0  . sitaGLIPtin (JANUVIA) 100 MG tablet Take 1 tablet (100 mg total) by mouth daily. 90 tablet 3  . topiramate (TOPAMAX) 25 MG tablet TAKE 1 TABLET BY MOUTH TWICE A DAY 180 tablet 1   No current facility-administered medications on file prior to visit.    Allergies  Allergen Reactions  . Erythromycin Rash    Family History  Problem Relation Age of Onset  . Diabetes Mother   . Heart disease Mother     BP 130/80 (BP Location: Left Arm, Patient Position: Sitting, Cuff Size: Large)   Pulse 90   Ht 5' 11"  (1.803 m)   Wt 275 lb 12.8 oz (125.1 kg)   SpO2 99%   BMI 38.47 kg/m   Review of Systems Denies  nausea.      Objective:   Physical Exam VITAL SIGNS:  See vs page GENERAL: no distress Pulses: dorsalis pedis intact bilat.   MSK: no deformity of the feet CV: no leg edema Skin:  no ulcer on the feet.  normal color and temp on the feet. Neuro: sensation is intact to touch on the feet  Lab Results  Component Value Date   CREATININE 0.72 11/23/2018   BUN 10 11/23/2018   NA 137 11/23/2018   K 4.0 11/23/2018   CL 103 11/23/2018   CO2 27 11/23/2018     Lab Results  Component Value Date   HGBA1C 6.2 (A) 08/23/2019       Assessment & Plan:  Type 2 DM: well-controlled   Patient Instructions  Please continue the same medications.   check your blood sugar once a day.  vary the time of day when you check, between before the 3 meals, and at bedtime.  also check if you have symptoms of your blood sugar being too high or too low.  please keep a record of the readings and bring it to your next appointment here (or you can bring the meter itself).  You can write it on any piece of paper.  please call us sooner if your blood sugar goes below 70, or if you have a lot of readings over 200. Please come back for a follow-up appointment in 6 months.

## 2019-08-23 NOTE — Patient Instructions (Addendum)
Please continue the same medications check your blood sugar once a day.  vary the time of day when you check, between before the 3 meals, and at bedtime.  also check if you have symptoms of your blood sugar being too high or too low.  please keep a record of the readings and bring it to your next appointment here (or you can bring the meter itself).  You can write it on any piece of paper.  please call us sooner if your blood sugar goes below 70, or if you have a lot of readings over 200. Please come back for a follow-up appointment in 6 months.   

## 2019-09-02 ENCOUNTER — Other Ambulatory Visit: Payer: Self-pay | Admitting: Family Medicine

## 2019-09-02 DIAGNOSIS — D649 Anemia, unspecified: Secondary | ICD-10-CM

## 2019-09-11 ENCOUNTER — Ambulatory Visit: Payer: BC Managed Care – PPO | Admitting: Podiatry

## 2019-09-30 ENCOUNTER — Ambulatory Visit: Payer: BC Managed Care – PPO | Admitting: Podiatry

## 2019-10-09 ENCOUNTER — Ambulatory Visit: Payer: BC Managed Care – PPO | Admitting: Podiatry

## 2019-10-21 ENCOUNTER — Telehealth: Payer: Self-pay | Admitting: Family Medicine

## 2019-10-21 ENCOUNTER — Ambulatory Visit: Payer: BC Managed Care – PPO | Admitting: Podiatry

## 2019-10-21 NOTE — Telephone Encounter (Signed)
Received a message from the after hours line stating that patient called requesting for a prescription to be sent in for her cough. Patient last seen 04/2019. Called patient to discuss symptoms/possible appointment. Left message to give the office a call back.

## 2019-11-04 ENCOUNTER — Other Ambulatory Visit: Payer: Self-pay | Admitting: Family Medicine

## 2019-11-04 DIAGNOSIS — D649 Anemia, unspecified: Secondary | ICD-10-CM

## 2019-11-08 ENCOUNTER — Other Ambulatory Visit: Payer: Self-pay | Admitting: Family Medicine

## 2019-11-08 DIAGNOSIS — G43009 Migraine without aura, not intractable, without status migrainosus: Secondary | ICD-10-CM

## 2019-11-12 ENCOUNTER — Other Ambulatory Visit: Payer: Self-pay | Admitting: Family Medicine

## 2019-11-12 DIAGNOSIS — D649 Anemia, unspecified: Secondary | ICD-10-CM

## 2019-11-13 ENCOUNTER — Telehealth: Payer: Self-pay | Admitting: Family Medicine

## 2019-11-13 NOTE — Telephone Encounter (Signed)
Medication refill request  11/23/18 Last iron, TIBC, and ferritin panel - abnormal results  04/26/19 Last OV  09/02/19 Last refill #90 with 0 refills

## 2019-11-13 NOTE — Telephone Encounter (Signed)
LVM to call back to schedule a follow up appointment for anemia.

## 2020-02-07 ENCOUNTER — Other Ambulatory Visit: Payer: Self-pay | Admitting: Family Medicine

## 2020-02-07 DIAGNOSIS — D649 Anemia, unspecified: Secondary | ICD-10-CM

## 2020-02-07 NOTE — Telephone Encounter (Signed)
Last OV 04/26/19 Last 11/13/19 #90/0

## 2020-02-25 ENCOUNTER — Other Ambulatory Visit: Payer: Self-pay

## 2020-02-25 ENCOUNTER — Ambulatory Visit (INDEPENDENT_AMBULATORY_CARE_PROVIDER_SITE_OTHER): Payer: 59 | Admitting: Endocrinology

## 2020-02-25 ENCOUNTER — Encounter: Payer: Self-pay | Admitting: Endocrinology

## 2020-02-25 VITALS — BP 138/84 | HR 100 | Ht 71.0 in | Wt 284.0 lb

## 2020-02-25 DIAGNOSIS — E1165 Type 2 diabetes mellitus with hyperglycemia: Secondary | ICD-10-CM

## 2020-02-25 DIAGNOSIS — E0865 Diabetes mellitus due to underlying condition with hyperglycemia: Secondary | ICD-10-CM

## 2020-02-25 LAB — POCT GLYCOSYLATED HEMOGLOBIN (HGB A1C): Hemoglobin A1C: 6.8 % — AB (ref 4.0–5.6)

## 2020-02-25 MED ORDER — SITAGLIPTIN PHOSPHATE 100 MG PO TABS: 100.0000 mg | ORAL_TABLET | Freq: Every day | ORAL | 3 refills | Status: DC

## 2020-02-25 NOTE — Progress Notes (Signed)
Subjective:    Patient ID: Dawn Shields, female    DOB: [birth year], 34 y.o.   MRN: [id number]  HPI Pt returns for f/u of diabetes mellitus: DM type: 2 Dx'ed: 3846 Complications: none Therapy: 2 oral meds GDM: G0 DKA: never Severe hypoglycemia: never Pancreatitis: never Pancreatic imaging: never SDOH: She works 8AM-5PM Other: she took insulin for a brief time in 2020.   Interval history: she takes meds as rx'ed.  She says cbg's are well-controlled.  pt states she feels well in general.  Past Medical History:  Diagnosis Date  . Diabetes mellitus without complication ([place])   . Migraine     No past surgical history on file.  Social History   Socioeconomic History  . Marital status: Single    Spouse name: Not on file  . Number of children: Not on file  . Years of education: Not on file  . Highest education level: Not on file  Occupational History  . Not on file  Tobacco Use  . Smoking status: Never Smoker  . Smokeless tobacco: Never Used  Vaping Use  . Vaping Use: Never used  Substance and Sexual Activity  . Alcohol use: No  . Drug use: No  . Sexual activity: Not Currently    Comment: virgin  Other Topics Concern  . Not on file  Social History Narrative  . Not on file   Social Determinants of Health   Financial Resource Strain: Not on file  Food Insecurity: Not on file  Transportation Needs: Not on file  Physical Activity: Not on file  Stress: Not on file  Social Connections: Not on file  Intimate Partner Violence: Not on file    Current Outpatient Medications on File Prior to Visit  Medication Sig Dispense Refill  . Bayer Microlet Lancets lancets Use to test blood sugars 1-2 times daily. 100 each 12  . Blood Glucose Monitoring Suppl (CONTOUR NEXT MONITOR) w/Device KIT 1 each by Does not apply route daily. 1 kit 0  . cyclobenzaprine (FLEXERIL) 10 MG tablet Take 1 tablet (10 mg total) by mouth 2 (two) times daily as needed for muscle spasms. 10 tablet  0  . ferrous sulfate 325 (65 FE) MG tablet TAKE 1 TABLET BY MOUTH EVERY DAY WITH BREAKFAST 90 tablet 0  . glucose blood (CONTOUR NEXT TEST) test strip Use to test blood sugars one-two times daily. 100 each 12  . HYDROcodone-acetaminophen (NORCO/VICODIN) 5-325 MG tablet Take 1 tablet by mouth every 6 (six) hours as needed for severe pain. 4 tablet 0  . meloxicam (MOBIC) 7.5 MG tablet Take 1 tablet (7.5 mg total) by mouth daily. 30 tablet 0  . metFORMIN (GLUCOPHAGE-XR) 500 MG 24 hr tablet Take 2 tablets (1,000 mg total) by mouth 2 (two) times daily. 360 tablet 3  . methocarbamol (ROBAXIN) 500 MG tablet Take 1 tablet (500 mg total) by mouth every 8 (eight) hours as needed for muscle spasms. 40 tablet 0  . topiramate (TOPAMAX) 25 MG tablet TAKE 1 TABLET BY MOUTH TWICE A DAY 180 tablet 1   No current facility-administered medications on file prior to visit.    Allergies  Allergen Reactions  . Erythromycin Rash    Family History  Problem Relation Age of Onset  . Diabetes Mother   . Heart disease Mother     BP 138/84   Pulse 100   Ht 5' 11"  (1.803 m)   Wt 284 lb (128.8 kg)   SpO2 97%   BMI 39.61  kg/m    Review of Systems She denies hypoglycemia and nausea.      Objective:   Physical Exam VITAL SIGNS:  See vs page GENERAL: no distress Pulses: dorsalis pedis intact bilat.   MSK: no deformity of the feet CV: no leg edema Skin:  no ulcer on the feet.  normal color and temp on the feet. Neuro: sensation is intact to touch on the feet.    A1c=6.8%    Assessment & Plan:  Type 2 DM: uncontrolled, as A1c goal in this pt is the low 6's.  I advised her to increase rx, but she declines.    Patient Instructions  Please continue the same 2 diabetes medications.   check your blood sugar once a day.  vary the time of day when you check, between before the 3 meals, and at bedtime.  also check if you have symptoms of your blood sugar being too high or too low.  please keep a record of the  readings and bring it to your next appointment here (or you can bring the meter itself).  You can write it on any piece of paper.  please call us sooner if your blood sugar goes below 70, or if you have a lot of readings over 200.  Please come back for a follow-up appointment in 6 months.

## 2020-02-25 NOTE — Patient Instructions (Addendum)
Please continue the same 2 diabetes medications.   check your blood sugar once a day.  vary the time of day when you check, between before the 3 meals, and at bedtime.  also check if you have symptoms of your blood sugar being too high or too low.  please keep a record of the readings and bring it to your next appointment here (or you can bring the meter itself).  You can write it on any piece of paper.  please call us sooner if your blood sugar goes below 70, or if you have a lot of readings over 200.   Please come back for a follow-up appointment in 6 months.   

## 2020-05-15 ENCOUNTER — Other Ambulatory Visit: Payer: Self-pay | Admitting: Family Medicine

## 2020-05-15 DIAGNOSIS — D649 Anemia, unspecified: Secondary | ICD-10-CM

## 2020-05-18 ENCOUNTER — Encounter: Payer: 59 | Admitting: Nurse Practitioner

## 2020-05-19 ENCOUNTER — Telehealth: Payer: Self-pay | Admitting: Endocrinology

## 2020-05-19 ENCOUNTER — Other Ambulatory Visit: Payer: Self-pay | Admitting: Endocrinology

## 2020-05-19 NOTE — Telephone Encounter (Signed)
Pt called stating she needs a PA to be sent for her Januvia. Patient states she has been out for 2 days.  Insurance: [employer]  [id number] [place], [place] - [address] Phone:  [phone]  Fax:  [phone]

## 2020-05-21 ENCOUNTER — Other Ambulatory Visit: Payer: Self-pay

## 2020-05-21 MED ORDER — SITAGLIPTIN PHOSPHATE 100 MG PO TABS: 100.0000 mg | ORAL_TABLET | Freq: Every day | ORAL | 3 refills | Status: DC

## 2020-05-21 NOTE — Telephone Encounter (Signed)
[facility] called [facility] and they have not received a PA yet for Januvia. Pt says nurse needs to call [facility] to start PA. Pt is very frustrated as she states she has been out of medication for days now.  Pt requests a call today before 5pm at [phone].

## 2020-06-23 ENCOUNTER — Ambulatory Visit: Payer: 59 | Admitting: Nurse Practitioner

## 2020-08-13 ENCOUNTER — Other Ambulatory Visit: Payer: Self-pay | Admitting: Family Medicine

## 2020-08-13 DIAGNOSIS — D649 Anemia, unspecified: Secondary | ICD-10-CM

## 2020-08-20 ENCOUNTER — Ambulatory Visit: Payer: 59 | Admitting: Endocrinology

## 2020-08-24 ENCOUNTER — Other Ambulatory Visit: Payer: Self-pay | Admitting: Family Medicine

## 2020-08-24 DIAGNOSIS — D649 Anemia, unspecified: Secondary | ICD-10-CM

## 2020-09-23 ENCOUNTER — Ambulatory Visit: Payer: 59 | Admitting: Nurse Practitioner

## 2020-11-11 ENCOUNTER — Other Ambulatory Visit: Payer: Self-pay

## 2020-11-11 ENCOUNTER — Ambulatory Visit (INDEPENDENT_AMBULATORY_CARE_PROVIDER_SITE_OTHER): Payer: 59 | Admitting: Endocrinology

## 2020-11-11 VITALS — BP 120/84 | HR 98 | Ht 71.0 in | Wt 264.8 lb

## 2020-11-11 DIAGNOSIS — E1165 Type 2 diabetes mellitus with hyperglycemia: Secondary | ICD-10-CM

## 2020-11-11 DIAGNOSIS — E0865 Diabetes mellitus due to underlying condition with hyperglycemia: Secondary | ICD-10-CM

## 2020-11-11 LAB — POCT GLYCOSYLATED HEMOGLOBIN (HGB A1C): Hemoglobin A1C: 11.9 % — AB (ref 4.0–5.6)

## 2020-11-11 MED ORDER — CONTOUR NEXT TEST VI STRP: 1.0000 | ORAL_STRIP | Freq: Every day | 3 refills | Status: DC

## 2020-11-11 MED ORDER — METFORMIN HCL ER 500 MG PO TB24: 1000.0000 mg | ORAL_TABLET | Freq: Two times a day (BID) | ORAL | 3 refills | Status: DC

## 2020-11-11 MED ORDER — RYBELSUS 3 MG PO TABS: 3.0000 mg | ORAL_TABLET | Freq: Every day | ORAL | 3 refills | Status: DC

## 2020-11-11 NOTE — Progress Notes (Signed)
Subjective:    Patient ID: Dawn Shields, female    DOB: 09-20-1985, 35 y.o.   MRN: [id number]  HPI Pt returns for f/u of diabetes mellitus: DM type: 2 Dx'ed: 2020 Complications: none Therapy: 2 oral meds GDM: G0 DKA: never Severe hypoglycemia: never Pancreatitis: never Pancreatic imaging: never SDOH: She works 8AM-5PM Other: she took insulin for a brief time in 2020.   Interval history: she stopped Januvia, due to cost  She says cbg's are still well-controlled (despite today's A1c).  pt states she feels well in general.  Pt says she is not at risk for pregnancy.  Past Medical History:  Diagnosis Date   Diabetes mellitus without complication (HCC)    Migraine     No past surgical history on file.  Social History   Socioeconomic History   Marital status: Single    Spouse name: Not on file   Number of children: Not on file   Years of education: Not on file   Highest education level: Not on file  Occupational History   Not on file  Tobacco Use   Smoking status: Never   Smokeless tobacco: Never  Vaping Use   Vaping Use: Never used  Substance and Sexual Activity   Alcohol use: No   Drug use: No   Sexual activity: Not Currently    Comment: virgin  Other Topics Concern   Not on file  Social History Narrative   Not on file   Social Determinants of Health   Financial Resource Strain: Not on file  Food Insecurity: Not on file  Transportation Needs: Not on file  Physical Activity: Not on file  Stress: Not on file  Social Connections: Not on file  Intimate Partner Violence: Not on file    Current Outpatient Medications on File Prior to Visit  Medication Sig Dispense Refill   ferrous sulfate 325 (65 FE) MG tablet TAKE 1 TABLET BY MOUTH DAILY WITH BREAKFAST. **NEEDS APPOINTMENT BEFORE ANYMORE REFILLS GIVEN.** 90 tablet 0   topiramate (TOPAMAX) 25 MG tablet TAKE 1 TABLET BY MOUTH TWICE A DAY 180 tablet 1   No current facility-administered medications on file  prior to visit.    Allergies  Allergen Reactions   Erythromycin Rash    Family History  Problem Relation Age of Onset   Diabetes Mother    Heart disease Mother     BP 120/84 (BP Location: Right Arm, Patient Position: Sitting, Cuff Size: Large)   Pulse 98   Ht 5\' 11"  (1.803 m)   Wt 264 lb 12.8 oz (120.1 kg)   SpO2 98%   BMI 36.93 kg/m    Review of Systems She has lost 20 lbs since last ov.      Objective:   Physical Exam Pulses: dorsalis pedis intact bilat.   MSK: no deformity of the feet CV: no leg edema Skin:  no ulcer on the feet.  normal color and temp on the feet. Neuro: sensation is intact to touch on the feet.     A1c=11.9%    Assessment & Plan:  Type 2 DM: uncontrolled.  She declines to add any more than 1 med today.    Patient Instructions  Please continue the same metformin, and:  I have sent a prescription to your pharmacy, to add Rybelsus.   Please call or message next week, to tell us how the blood sugar is doing.   check your blood sugar once a day.  vary the time of day  when you check, between before the 3 meals, and at bedtime.  also check if you have symptoms of your blood sugar being too high or too low.  please keep a record of the readings and bring it to your next appointment here (or you can bring the meter itself).  You can write it on any piece of paper.  please call us sooner if your blood sugar goes below 70, or if you have a lot of readings over 200.   Please come back for a follow-up appointment in 2 months.

## 2020-11-11 NOTE — Patient Instructions (Addendum)
Please continue the same metformin, and:  I have sent a prescription to your pharmacy, to add Rybelsus.   Please call or message [place] next week, to tell us how the blood sugar is doing.   check your blood sugar once a day.  vary the time of day when you check, between before the 3 meals, and at bedtime.  also check if you have symptoms of your blood sugar being too high or too low.  please keep a record of the readings and bring it to your next appointment here (or you can bring the meter itself).  You can write it on any piece of paper.  please call us sooner if your blood sugar goes below 70, or if you have a lot of readings over 200.   Please come back for a follow-up appointment in 2 months.

## 2020-11-17 ENCOUNTER — Telehealth: Payer: Self-pay | Admitting: Endocrinology

## 2020-11-17 ENCOUNTER — Other Ambulatory Visit: Payer: Self-pay | Admitting: Endocrinology

## 2020-11-17 MED ORDER — GLIMEPIRIDE 4 MG PO TABS: 4.0000 mg | ORAL_TABLET | Freq: Every day | ORAL | 3 refills | Status: DC

## 2020-11-17 NOTE — Telephone Encounter (Signed)
Please advise 

## 2020-11-17 NOTE — Telephone Encounter (Signed)
Patient called to advise that since starting Rybelsus she has been feeling faint, light headed, and short of breath.  Patient would like to go back tot he medication she was taking before this change.    Patient also requesting a call back at [phone]

## 2020-11-17 NOTE — Telephone Encounter (Signed)
Message sent thru MyChart 

## 2020-11-21 ENCOUNTER — Other Ambulatory Visit: Payer: Self-pay | Admitting: Family Medicine

## 2020-11-21 DIAGNOSIS — D649 Anemia, unspecified: Secondary | ICD-10-CM

## 2020-11-22 ENCOUNTER — Other Ambulatory Visit: Payer: Self-pay | Admitting: Family Medicine

## 2020-11-22 DIAGNOSIS — G43009 Migraine without aura, not intractable, without status migrainosus: Secondary | ICD-10-CM

## 2020-12-04 ENCOUNTER — Telehealth: Payer: Self-pay | Admitting: Family Medicine

## 2020-12-04 NOTE — Telephone Encounter (Signed)
Pt called I and read her the message. She understood what I was saying.

## 2020-12-04 NOTE — Telephone Encounter (Signed)
Pt requesting Rx for yeast infection

## 2020-12-04 NOTE — Telephone Encounter (Signed)
Patient will need to come in for evaluation to make sure she has a yeast infection, if we do not have any available appointments she will need to be seen at urgent care. Have not been seen since February 2021.
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
## 2020-12-23 ENCOUNTER — Other Ambulatory Visit: Payer: Self-pay | Admitting: Family Medicine

## 2020-12-23 DIAGNOSIS — G43009 Migraine without aura, not intractable, without status migrainosus: Secondary | ICD-10-CM

## 2020-12-24 ENCOUNTER — Telehealth: Payer: Self-pay | Admitting: Endocrinology

## 2020-12-24 NOTE — Telephone Encounter (Signed)
Pt calling in, says been on glimepiride (AMARYL) 4 MG tablet for 2weeks and has been having blurry vision. Did not read it was a symptom and wants to know if it's something her body has to get used to. She complains of her blood sugar dropping. Once instance from 132 to 119 and has to find herself having to sit down before she falls. Also, pt would like to go back on Jardiance. Pt contact [phone]

## 2020-12-25 ENCOUNTER — Other Ambulatory Visit: Payer: Self-pay | Admitting: Endocrinology

## 2020-12-25 MED ORDER — EMPAGLIFLOZIN 25 MG PO TABS: 25.0000 mg | ORAL_TABLET | Freq: Every day | ORAL | 11 refills | Status: DC

## 2020-12-25 NOTE — Telephone Encounter (Signed)
Message sent thru MyChart 

## 2020-12-25 NOTE — Telephone Encounter (Signed)
Patient is scheduled for appointment on 01/18/21 

## 2020-12-26 ENCOUNTER — Other Ambulatory Visit: Payer: Self-pay | Admitting: Family Medicine

## 2020-12-26 DIAGNOSIS — D649 Anemia, unspecified: Secondary | ICD-10-CM

## 2021-01-12 ENCOUNTER — Other Ambulatory Visit: Payer: Self-pay | Admitting: Family Medicine

## 2021-01-12 DIAGNOSIS — D649 Anemia, unspecified: Secondary | ICD-10-CM

## 2021-01-18 ENCOUNTER — Other Ambulatory Visit: Payer: Self-pay

## 2021-01-18 ENCOUNTER — Ambulatory Visit (INDEPENDENT_AMBULATORY_CARE_PROVIDER_SITE_OTHER): Payer: 59 | Admitting: Endocrinology

## 2021-01-18 VITALS — BP 124/82 | HR 96 | Ht 71.0 in | Wt 270.4 lb

## 2021-01-18 DIAGNOSIS — E0865 Diabetes mellitus due to underlying condition with hyperglycemia: Secondary | ICD-10-CM

## 2021-01-18 DIAGNOSIS — E1165 Type 2 diabetes mellitus with hyperglycemia: Secondary | ICD-10-CM | POA: Diagnosis not present

## 2021-01-18 LAB — POCT GLYCOSYLATED HEMOGLOBIN (HGB A1C): Hemoglobin A1C: 10.4 % — AB (ref 4.0–5.6)

## 2021-01-18 MED ORDER — EMPAGLIFLOZIN 25 MG PO TABS: 25.0000 mg | ORAL_TABLET | Freq: Every day | ORAL | 3 refills | Status: DC

## 2021-01-18 NOTE — Patient Instructions (Addendum)
Please continue the same metformin and glimepiride, and: I have sent a prescription to your pharmacy, to add Jardiance. check your blood sugar once a day.  vary the time of day when you check, between before the 3 meals, and at bedtime.  also check if you have symptoms of your blood sugar being too high or too low.  please keep a record of the readings and bring it to your next appointment here (or you can bring the meter itself).  You can write it on any piece of paper.  please call us sooner if your blood sugar goes below 70, or if you have a lot of readings over 200.   Please come back for a follow-up appointment in 3 months.

## 2021-01-18 NOTE — Progress Notes (Signed)
Subjective:    Patient ID: Dawn Shields, female    DOB: 12-Mar-1986, 35 y.o.   MRN: [id number]  HPI Pt returns for f/u of diabetes mellitus: DM type: 2 Dx'ed: 2020 Complications: none Therapy: 3 oral meds GDM: G0 DKA: never Severe hypoglycemia: never Pancreatitis: never Pancreatic imaging: never SDOH: She works 8AM-5PM Other: she took insulin for a brief time in 2020; she did not tolerate Rybelsus (lightheadedness); Pt says she is not at risk for pregnancy.   Interval history: She says cbg's vary from 129-154.  pt states she feels well in general.  She takes just amaryl and metformin.    Past Medical History:  Diagnosis Date   Diabetes mellitus without complication (HCC)    Migraine     No past surgical history on file.  Social History   Socioeconomic History   Marital status: Single    Spouse name: Not on file   Number of children: Not on file   Years of education: Not on file   Highest education level: Not on file  Occupational History   Not on file  Tobacco Use   Smoking status: Never   Smokeless tobacco: Never  Vaping Use   Vaping Use: Never used  Substance and Sexual Activity   Alcohol use: No   Drug use: No   Sexual activity: Not Currently    Comment: virgin  Other Topics Concern   Not on file  Social History Narrative   Not on file   Social Determinants of Health   Financial Resource Strain: Not on file  Food Insecurity: Not on file  Transportation Needs: Not on file  Physical Activity: Not on file  Stress: Not on file  Social Connections: Not on file  Intimate Partner Violence: Not on file    Current Outpatient Medications on File Prior to Visit  Medication Sig Dispense Refill   ferrous sulfate 325 (65 FE) MG tablet TAKE 1 TABLET BY MOUTH DAILY WITH BREAKFAST. **NEEDS APPOINTMENT BEFORE ANYMORE REFILLS GIVEN.** 30 tablet 1   glimepiride (AMARYL) 4 MG tablet Take 1 tablet (4 mg total) by mouth daily before breakfast. 90 tablet 3   glucose  blood (CONTOUR NEXT TEST) test strip 1 each by Other route daily. And lancets 1/day 100 each 3   metFORMIN (GLUCOPHAGE-XR) 500 MG 24 hr tablet Take 2 tablets (1,000 mg total) by mouth 2 (two) times daily. 360 tablet 3   topiramate (TOPAMAX) 25 MG tablet TAKE 1 TABLET BY MOUTH TWICE A DAY 60 tablet 1   No current facility-administered medications on file prior to visit.    Allergies  Allergen Reactions   Erythromycin Rash    Family History  Problem Relation Age of Onset   Diabetes Mother    Heart disease Mother     BP 124/82 (BP Location: Right Arm, Patient Position: Sitting, Cuff Size: Large)   Pulse 96   Ht 5\' 11"  (1.803 m)   Wt 270 lb 6.4 oz (122.7 kg)   SpO2 98%   BMI 37.71 kg/m    Review of Systems     Objective:   Physical Exam   A1c=10.4%    Assessment & Plan:  Type 2 DM: uncontrolled.  She declines to resume insulin, or to check fructosamine.    Patient Instructions  Please continue the same metformin and glimepiride, and: I have sent a prescription to your pharmacy, to add Jardiance. check your blood sugar once a day.  vary the time of day when you  check, between before the 3 meals, and at bedtime.  also check if you have symptoms of your blood sugar being too high or too low.  please keep a record of the readings and bring it to your next appointment here (or you can bring the meter itself).  You can write it on any piece of paper.  please call us sooner if your blood sugar goes below 70, or if you have a lot of readings over 200.   Please come back for a follow-up appointment in 3 months.

## 2021-02-06 ENCOUNTER — Telehealth: Payer: Self-pay | Admitting: Family Medicine

## 2021-02-06 DIAGNOSIS — D649 Anemia, unspecified: Secondary | ICD-10-CM

## 2021-02-08 MED ORDER — FERROUS SULFATE 325 (65 FE) MG PO TABS: ORAL_TABLET | ORAL | 1 refills | Status: DC

## 2021-02-08 NOTE — Telephone Encounter (Signed)
Pt requesting refill on Ferrous Sulfate

## 2021-02-08 NOTE — Telephone Encounter (Signed)
Patient aware Rx sent in and will pick up 

## 2021-02-08 NOTE — Addendum Note (Signed)
Addended by: Andrez Grime on: 02/08/2021 04:55 PM   Modules accepted: Orders

## 2021-02-08 NOTE — Telephone Encounter (Signed)
Patient has an appointment scheduled for follow up on labs and medications on 03/26/21 asking if she could have refill on iron intil appointment. Please advise.

## 2021-02-19 ENCOUNTER — Other Ambulatory Visit: Payer: Self-pay

## 2021-02-19 ENCOUNTER — Encounter (HOSPITAL_BASED_OUTPATIENT_CLINIC_OR_DEPARTMENT_OTHER): Payer: Self-pay | Admitting: *Deleted

## 2021-02-19 DIAGNOSIS — R1033 Periumbilical pain: Secondary | ICD-10-CM | POA: Diagnosis not present

## 2021-02-19 DIAGNOSIS — Y990 Civilian activity done for income or pay: Secondary | ICD-10-CM | POA: Insufficient documentation

## 2021-02-19 DIAGNOSIS — X500XXA Overexertion from strenuous movement or load, initial encounter: Secondary | ICD-10-CM | POA: Diagnosis not present

## 2021-02-19 DIAGNOSIS — Z7984 Long term (current) use of oral hypoglycemic drugs: Secondary | ICD-10-CM | POA: Diagnosis not present

## 2021-02-19 DIAGNOSIS — E119 Type 2 diabetes mellitus without complications: Secondary | ICD-10-CM | POA: Diagnosis not present

## 2021-02-19 NOTE — ED Triage Notes (Addendum)
C/o abd pain x 1 day, after lifting heavy pt , HX hernia

## 2021-02-20 ENCOUNTER — Emergency Department (HOSPITAL_BASED_OUTPATIENT_CLINIC_OR_DEPARTMENT_OTHER)
Admission: EM | Admit: 2021-02-20 | Discharge: 2021-02-20 | Disposition: A | Discharge status: Home / Self Care | Payer: 59 | Attending: Emergency Medicine | Admitting: Emergency Medicine

## 2021-02-20 DIAGNOSIS — Z8719 Personal history of other diseases of the digestive system: Secondary | ICD-10-CM

## 2021-02-20 DIAGNOSIS — S39011A Strain of muscle, fascia and tendon of abdomen, initial encounter: Secondary | ICD-10-CM

## 2021-02-20 LAB — URINALYSIS, ROUTINE W REFLEX MICROSCOPIC
Bilirubin Urine: NEGATIVE
Glucose, UA: NEGATIVE mg/dL
Hgb urine dipstick: NEGATIVE
Ketones, ur: NEGATIVE mg/dL
Leukocytes,Ua: NEGATIVE
Nitrite: NEGATIVE
Protein, ur: NEGATIVE mg/dL
Specific Gravity, Urine: 1.025 (ref 1.005–1.030)
pH: 7 (ref 5.0–8.0)

## 2021-02-20 LAB — PREGNANCY, URINE: Preg Test, Ur: NEGATIVE

## 2021-02-20 NOTE — ED Provider Notes (Addendum)
[facility] EMERGENCY DEPARTMENT Provider Note   CSN: [id number] Arrival date & time: 02/19/21  2210     History Chief Complaint  Patient presents with   Abdominal Pain    Dawn Shields is a 35 y.o. female.  Patient is a 35 year old female with history of diabetes and ventral hernia.  Patient presents today with complaints of abdominal pain.  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  This sharp pain was short-lived, however she has been having some discomfort since.  She denies any bowel or bladder complaints.  She denies any fevers or chills.  She denies feeling of lump in the area of her hernia.  The history is provided by the patient.  Abdominal Pain Pain location:  Periumbilical Pain quality: aching   Pain radiates to:  Does not radiate Pain severity:  Moderate Onset quality:  Sudden Timing:  Constant Progression:  Partially resolved Chronicity:  New Relieved by:  Movement and palpation Worsened by:  Nothing     Past Medical History:  Diagnosis Date   Diabetes mellitus without complication (HCC)    Migraine     Patient Active Problem List   Diagnosis Date Noted   Strain of right knee 04/26/2019   Strain of lumbar region 04/26/2019   Lab test negative for COVID-19 virus 02/22/2019   Nasal congestion 02/22/2019   Cervical strain 12/24/2018   Diabetes mellitus due to underlying condition, uncontrolled, with hyperglycemia (HCC) 09/07/2018   Iron deficiency anemia due to chronic blood loss 09/07/2018   Newly diagnosed diabetes (HCC) 08/29/2018   Yeast vaginitis 08/17/2018   History of migraine 08/17/2018   Elevated LDL cholesterol level 08/17/2018   Elevated pulse rate 08/17/2018   Grieving 08/17/2018   INSOMNIA, MILD 06/02/2009   HYPERCHOLESTEROLEMIA 03/27/2008   ANEMIA 03/27/2008   OBESITY 03/26/2008   VIRAL URI 03/17/2008   Migraine without aura  03/05/2008   GERD 03/05/2008   ACNE VULGARIS 03/05/2008   CHEST PAIN 03/05/2008   ELEVATED BP READING WITHOUT DX HYPERTENSION 03/05/2008    History reviewed. No pertinent surgical history.   OB History     Gravida  0   Para  0   Term  0   Preterm  0   AB  0   Living  0      SAB  0   IAB  0   Ectopic  0   Multiple  0   Live Births  0           Family History  Problem Relation Age of Onset   Diabetes Mother    Heart disease Mother     Social History   Tobacco Use   Smoking status: Never   Smokeless tobacco: Never  Vaping Use   Vaping Use: Never used  Substance Use Topics   Alcohol use: No   Drug use: No    Home Medications Prior to Admission medications   Medication Sig Start Date End Date Taking? Authorizing Provider  empagliflozin (JARDIANCE) 25 MG TABS tablet Take 1 tablet (25 mg total) by mouth daily before breakfast. 01/18/21   Romero Belling, MD  ferrous sulfate 325 (65 FE) MG tablet TAKE 1 TABLET BY MOUTH DAILY WITH BREAKFAST. **NEEDS APPOINTMENT BEFORE ANYMORE REFILLS GIVEN.** 02/08/21   Mliss Sax, MD  glimepiride (AMARYL) 4 MG tablet Take 1 tablet (4 mg total) by mouth  daily before breakfast. 11/17/20   Romero Belling, MD  glucose blood (CONTOUR NEXT TEST) test strip 1 each by Other route daily. And lancets 1/day 11/11/20   Romero Belling, MD  metFORMIN (GLUCOPHAGE-XR) 500 MG 24 hr tablet Take 2 tablets (1,000 mg total) by mouth 2 (two) times daily. 11/11/20   Romero Belling, MD  topiramate (TOPAMAX) 25 MG tablet TAKE 1 TABLET BY MOUTH TWICE A DAY 11/23/20   Mliss Sax, MD    Allergies    Erythromycin  Review of Systems   Review of Systems  Gastrointestinal:  Positive for abdominal pain.  All other systems reviewed and are negative.  Physical Exam Updated Vital Signs BP 133/86 (BP Location: Right Arm)   Pulse 94   Temp 98.2 F (36.8 C) (Oral)   Resp 17   Ht 5\' 9"  (1.753 m)   Wt 124.7 kg   LMP 02/02/2021    SpO2 100%   BMI 40.61 kg/m   Physical Exam Vitals and nursing note reviewed.  Constitutional:      General: She is not in acute distress.    Appearance: She is well-developed. She is not diaphoretic.  HENT:     Head: Normocephalic and atraumatic.  Cardiovascular:     Rate and Rhythm: Normal rate and regular rhythm.     Heart sounds: No murmur heard.   No friction rub. No gallop.  Pulmonary:     Effort: Pulmonary effort is normal. No respiratory distress.     Breath sounds: Normal breath sounds. No wheezing.  Abdominal:     General: Bowel sounds are normal. There is no distension.     Palpations: Abdomen is soft.     Tenderness: There is abdominal tenderness in the periumbilical area.     Comments: There is mild tenderness in the periumbilical region, but no palpable incarceration or hernia.  Exam limited somewhat secondary to obesity.  Musculoskeletal:        General: Normal range of motion.     Cervical back: Normal range of motion and neck supple.  Skin:    General: Skin is warm and dry.  Neurological:     General: No focal deficit present.     Mental Status: She is alert and oriented to person, place, and time.    ED Results / Procedures / Treatments   Labs (all labs ordered are listed, but only abnormal results are displayed) Labs Reviewed  PREGNANCY, URINE  URINALYSIS, ROUTINE W REFLEX MICROSCOPIC    EKG None  Radiology No results found.  Procedures Procedures   Medications Ordered in ED Medications - No data to display  ED Course  I have reviewed the triage vital signs and the nursing notes.  Pertinent labs & imaging results that were available during my care of the patient were reviewed by me and considered in my medical decision making (see chart for details).    MDM Rules/Calculators/A&P  Patient presenting here with complaints of abdominal wall pain as described in the HPI.  I suspect either related to inflammation of her hernia or an abdominal  wall strain.  She is having no other GI symptoms, is afebrile, and vitals are stable.  Urinalysis is clear.  I discussed the possibility of imaging studies with the patient, however she feels as though her pain is likely muscular and is elected to hold off on a CT scan unless her symptoms worsen.  At this point, patient seems appropriate for discharge.  Given the history, I suspect an  abdominal wall strain and will recommend ibuprofen, rest, and follow-up if not improving.  Final Clinical Impression(s) / ED Diagnoses Final diagnoses:  None    Rx / DC Orders ED Discharge Orders     None        Geoffery Lyons, MD 02/20/21 0201    Geoffery Lyons, MD 02/20/21 [phone]

## 2021-02-20 NOTE — Discharge Instructions (Signed)
Begin taking ibuprofen 600 mg every 6 hours as needed for pain.  Rest.  Return to the emergency department if you develop worsening pain, high fever, vomiting, bloody stools, or other new and concerning symptoms.

## 2021-02-20 NOTE — ED Notes (Signed)
Discharge instructions discussed with pt, pt verbalized understanding with no questions at this time.

## 2021-03-02 ENCOUNTER — Ambulatory Visit: Payer: 59 | Admitting: Nurse Practitioner

## 2021-03-02 NOTE — Progress Notes (Deleted)
   Dawn Shields [birth year] [id number]   History:  35 y.o. G0 presents for annual exam. Monthly cycles. Normal pap history. Has never been sexually active. DM, migraines managed by PCP.   Gynecologic History Patient's last menstrual period was 02/02/2021.   Contraception/Family planning: abstinence Sexually active: ***  Health Maintenance Last Pap: 05/15/2019. Results were: Normal, 5-year repeat Last mammogram: Not indicated Last colonoscopy: Not indicated Last Dexa: Not indicated  Past medical history, past surgical history, family history and social history were all reviewed and documented in the EPIC chart.  ROS:  A ROS was performed and pertinent positives and negatives are included.  Exam:  There were no vitals filed for this visit. There is no height or weight on file to calculate BMI.  General appearance:  Normal Thyroid:  Symmetrical, normal in size, without palpable masses or nodularity. Respiratory  Auscultation:  Clear without wheezing or rhonchi Cardiovascular  Auscultation:  Regular rate, without rubs, murmurs or gallops  Edema/varicosities:  Not grossly evident Abdominal  Soft,nontender, without masses, guarding or rebound.  Liver/spleen:  No organomegaly noted  Hernia:  None appreciated  Skin  Inspection:  Grossly normal Breasts: Examined lying and sitting.   Right: Without masses, retractions, nipple discharge or axillary adenopathy.   Left: Without masses, retractions, nipple discharge or axillary adenopathy. Genitourinary   Inguinal/mons:  Normal without inguinal adenopathy  External genitalia:  Normal appearing vulva with no masses, tenderness, or lesions  BUS/Urethra/Skene's glands:  Normal  Vagina:  Normal appearing with normal color and discharge, no lesions  Cervix:  Normal appearing without discharge or lesions  Uterus:  Normal in size, shape and contour.  Midline and mobile, nontender  Adnexa/parametria:     Rt: Normal in size, without  masses or tenderness.   Lt: Normal in size, without masses or tenderness.  Anus and perineum: Normal  Patient informed chaperone available to be present for breast and pelvic exam. Patient has requested no chaperone to be present. Patient has been advised what will be completed during breast and pelvic exam.   Assessment/Plan:  35 y.o. G0 for annual exam.    Return in 1 year for annual.   Dawn Mackie DNP, 9:15 AM 03/02/2021

## 2021-03-06 ENCOUNTER — Other Ambulatory Visit: Payer: Self-pay | Admitting: Family Medicine

## 2021-03-06 DIAGNOSIS — G43009 Migraine without aura, not intractable, without status migrainosus: Secondary | ICD-10-CM

## 2021-03-06 DIAGNOSIS — D649 Anemia, unspecified: Secondary | ICD-10-CM

## 2021-03-11 ENCOUNTER — Other Ambulatory Visit: Payer: Self-pay | Admitting: Family Medicine

## 2021-03-11 DIAGNOSIS — G43009 Migraine without aura, not intractable, without status migrainosus: Secondary | ICD-10-CM

## 2021-03-11 DIAGNOSIS — D649 Anemia, unspecified: Secondary | ICD-10-CM

## 2021-03-26 ENCOUNTER — Ambulatory Visit: Payer: 59 | Admitting: Family Medicine

## 2021-04-12 ENCOUNTER — Ambulatory Visit: Payer: Self-pay | Admitting: Family Medicine

## 2021-04-15 ENCOUNTER — Ambulatory Visit: Payer: 59 | Admitting: Nurse Practitioner

## 2021-04-15 DIAGNOSIS — Z0289 Encounter for other administrative examinations: Secondary | ICD-10-CM

## 2021-04-15 NOTE — Progress Notes (Deleted)
° °  RHENA GLACE 1985-07-23 [id number]   History:  36 y.o. G0 presents for annual exam. Monthly cycles. Normal pap history. Never been sexually active. Diabetes managed by PCP.   Gynecologic History No LMP recorded.   Contraception/Family planning: abstinence Sexually active: ***  Health Maintenance Last Pap: 05/15/2019. Results were: Normal, 5-year repeat Last mammogram: Not indicated Last colonoscopy: Not indicated Last Dexa: Not indicated  Past medical history, past surgical history, family history and social history were all reviewed and documented in the EPIC chart.  ROS:  A ROS was performed and pertinent positives and negatives are included.  Exam:  There were no vitals filed for this visit. There is no height or weight on file to calculate BMI.  General appearance:  Normal Thyroid:  Symmetrical, normal in size, without palpable masses or nodularity. Respiratory  Auscultation:  Clear without wheezing or rhonchi Cardiovascular  Auscultation:  Regular rate, without rubs, murmurs or gallops  Edema/varicosities:  Not grossly evident Abdominal  Soft,nontender, without masses, guarding or rebound.  Liver/spleen:  No organomegaly noted  Hernia:  None appreciated  Skin  Inspection:  Grossly normal Breasts: Examined lying and sitting.   Right: Without masses, retractions, nipple discharge or axillary adenopathy.   Left: Without masses, retractions, nipple discharge or axillary adenopathy. Genitourinary   Inguinal/mons:  Normal without inguinal adenopathy  External genitalia:  Normal appearing vulva with no masses, tenderness, or lesions  BUS/Urethra/Skene's glands:  Normal  Vagina:  Normal appearing with normal color and discharge, no lesions  Cervix:  Normal appearing without discharge or lesions  Uterus:  Normal in size, shape and contour.  Midline and mobile, nontender  Adnexa/parametria:     Rt: Normal in size, without masses or tenderness.   Lt: Normal in size,  without masses or tenderness.  Anus and perineum: Normal  Patient informed chaperone available to be present for breast and pelvic exam. Patient has requested no chaperone to be present. Patient has been advised what will be completed during breast and pelvic exam.   Assessment/Plan:  36 y.o. G0 for annual exam.    Return in 1 year for annual.   [name], 7:47 AM 04/15/2021

## 2021-04-20 ENCOUNTER — Ambulatory Visit: Payer: 59 | Admitting: Endocrinology

## 2021-05-01 ENCOUNTER — Other Ambulatory Visit: Payer: Self-pay | Admitting: Family Medicine

## 2021-05-01 DIAGNOSIS — D649 Anemia, unspecified: Secondary | ICD-10-CM

## 2021-05-01 DIAGNOSIS — G43009 Migraine without aura, not intractable, without status migrainosus: Secondary | ICD-10-CM

## 2021-05-03 ENCOUNTER — Ambulatory Visit: Payer: Self-pay | Admitting: Family Medicine

## 2021-05-03 ENCOUNTER — Encounter (HOSPITAL_COMMUNITY): Payer: Self-pay

## 2021-05-03 ENCOUNTER — Emergency Department (HOSPITAL_COMMUNITY): Payer: 59

## 2021-05-03 ENCOUNTER — Other Ambulatory Visit: Payer: Self-pay

## 2021-05-03 ENCOUNTER — Emergency Department (HOSPITAL_COMMUNITY)
Admission: EM | Admit: 2021-05-03 | Discharge: 2021-05-03 | Disposition: A | Discharge status: Home / Self Care | Payer: 59 | Attending: Emergency Medicine | Admitting: Emergency Medicine

## 2021-05-03 DIAGNOSIS — R079 Chest pain, unspecified: Secondary | ICD-10-CM | POA: Insufficient documentation

## 2021-05-03 DIAGNOSIS — Z5321 Procedure and treatment not carried out due to patient leaving prior to being seen by health care provider: Secondary | ICD-10-CM | POA: Diagnosis not present

## 2021-05-03 LAB — CBC
HCT: 35 % — ABNORMAL LOW (ref 36.0–46.0)
Hemoglobin: 10.4 g/dL — ABNORMAL LOW (ref 12.0–15.0)
MCH: 24.4 pg — ABNORMAL LOW (ref 26.0–34.0)
MCHC: 29.7 g/dL — ABNORMAL LOW (ref 30.0–36.0)
MCV: 82 fL (ref 80.0–100.0)
Platelets: 432 10*3/uL — ABNORMAL HIGH (ref 150–400)
RBC: 4.27 MIL/uL (ref 3.87–5.11)
RDW: 12.8 % (ref 11.5–15.5)
WBC: 10 10*3/uL (ref 4.0–10.5)
nRBC: 0 % (ref 0.0–0.2)

## 2021-05-03 LAB — BASIC METABOLIC PANEL
Anion gap: 8 (ref 5–15)
BUN: 9 mg/dL (ref 6–20)
CO2: 23 mmol/L (ref 22–32)
Calcium: 9.6 mg/dL (ref 8.9–10.3)
Chloride: 105 mmol/L (ref 98–111)
Creatinine, Ser: 0.69 mg/dL (ref 0.44–1.00)
GFR, Estimated: >60 mL/min (ref >60)
Glucose, Bld: 254 mg/dL — ABNORMAL HIGH (ref 70–99)
Potassium: 3.6 mmol/L (ref 3.5–5.1)
Sodium: 136 mmol/L (ref 135–145)

## 2021-05-03 LAB — HCG, QUANTITATIVE, PREGNANCY: hCG, Beta Chain, Quant, S: <1 m[IU]/mL (ref <5)

## 2021-05-03 LAB — TROPONIN I (HIGH SENSITIVITY): Troponin I (High Sensitivity): 2 ng/L (ref <18)

## 2021-05-03 NOTE — ED Triage Notes (Signed)
Pt complains of chest pain/ pressure x 2 days.

## 2021-05-07 ENCOUNTER — Other Ambulatory Visit: Payer: Self-pay

## 2021-05-07 ENCOUNTER — Telehealth: Payer: Self-pay | Admitting: Family Medicine

## 2021-05-07 ENCOUNTER — Ambulatory Visit (INDEPENDENT_AMBULATORY_CARE_PROVIDER_SITE_OTHER): Payer: PRIVATE HEALTH INSURANCE | Admitting: Family Medicine

## 2021-05-07 ENCOUNTER — Encounter: Payer: Self-pay | Admitting: Family Medicine

## 2021-05-07 VITALS — BP 130/78 | HR 97 | Temp 97.2°F | Ht 71.0 in | Wt 272.0 lb

## 2021-05-07 DIAGNOSIS — G43009 Migraine without aura, not intractable, without status migrainosus: Secondary | ICD-10-CM

## 2021-05-07 DIAGNOSIS — D500 Iron deficiency anemia secondary to blood loss (chronic): Secondary | ICD-10-CM | POA: Diagnosis not present

## 2021-05-07 DIAGNOSIS — D649 Anemia, unspecified: Secondary | ICD-10-CM

## 2021-05-07 DIAGNOSIS — Z0000 Encounter for general adult medical examination without abnormal findings: Secondary | ICD-10-CM | POA: Diagnosis not present

## 2021-05-07 DIAGNOSIS — K219 Gastro-esophageal reflux disease without esophagitis: Secondary | ICD-10-CM

## 2021-05-07 DIAGNOSIS — E7800 Pure hypercholesterolemia, unspecified: Secondary | ICD-10-CM | POA: Diagnosis not present

## 2021-05-07 MED ORDER — FERROUS SULFATE 325 (65 FE) MG PO TABS: ORAL_TABLET | ORAL | 1 refills | Status: DC

## 2021-05-07 MED ORDER — TOPIRAMATE 25 MG PO TABS: 25.0000 mg | ORAL_TABLET | Freq: Two times a day (BID) | ORAL | 4 refills | Status: DC

## 2021-05-07 NOTE — Telephone Encounter (Signed)
When Dawn Shields was here recently she mentioned about heartburn and wondered about a med for it.

## 2021-05-07 NOTE — Progress Notes (Signed)
Established Patient Office Visit  Subjective:  Patient ID: Dawn Shields, female    DOB: 1985-04-07  Age: 36 y.o. MRN: [id number]  CC:  Chief Complaint  Patient presents with   Follow-up    Follow up on labs, refill on medication. Concern about recent heart burn issues. Patient had cereal this morning.     HPI Dawn Shields presents for health check and follow-up of anemia and migraine headache.  Topamax seems to be working well for her.  On average migraines are coming perhaps every few weeks.  They are responding to Tylenol and/or Excedrin migraine.  Continues daily iron.  Continues follow-up with GYN for healthcare and with endocrinology for diabetes. Lost of follow up for 2 years.  Past Medical History:  Diagnosis Date   Diabetes mellitus without complication ([place])    Migraine     No past surgical history on file.  Family History  Problem Relation Age of Onset   Diabetes Mother    Heart disease Mother     Social History   Socioeconomic History   Marital status: Single    Spouse name: Not on file   Number of children: Not on file   Years of education: Not on file   Highest education level: Not on file  Occupational History   Not on file  Tobacco Use   Smoking status: Never   Smokeless tobacco: Never  Vaping Use   Vaping Use: Never used  Substance and Sexual Activity   Alcohol use: No   Drug use: No   Sexual activity: Not Currently    Birth control/protection: None    Comment: virgin  Other Topics Concern   Not on file  Social History Narrative   Not on file   Social Determinants of Health   Financial Resource Strain: Not on file  Food Insecurity: Not on file  Transportation Needs: Not on file  Physical Activity: Not on file  Stress: Not on file  Social Connections: Not on file  Intimate Partner Violence: Not on file    Outpatient Medications Prior to Visit  Medication Sig Dispense Refill   empagliflozin (JARDIANCE) 25 MG TABS tablet Take 1  tablet (25 mg total) by mouth daily before breakfast. 90 tablet 3   glimepiride (AMARYL) 4 MG tablet Take 1 tablet (4 mg total) by mouth daily before breakfast. 90 tablet 3   glucose blood (CONTOUR NEXT TEST) test strip 1 each by Other route daily. And lancets 1/day 100 each 3   metFORMIN (GLUCOPHAGE-XR) 500 MG 24 hr tablet Take 2 tablets (1,000 mg total) by mouth 2 (two) times daily. 360 tablet 3   minocycline (MINOCIN) 100 MG capsule Take 100 mg by mouth daily.     ferrous sulfate 325 (65 FE) MG tablet TAKE 1 TABLET BY MOUTH DAILY WITH BREAKFAST. **NEEDS APPOINTMENT BEFORE ANYMORE REFILLS GIVEN.** 30 tablet 1   topiramate (TOPAMAX) 25 MG tablet TAKE 1 TABLET BY MOUTH TWICE A DAY 60 tablet 1   No facility-administered medications prior to visit.    Allergies  Allergen Reactions   Erythromycin Rash    ROS Review of Systems  Constitutional: Negative.   HENT: Negative.    Eyes:  Negative for photophobia and visual disturbance.  Respiratory: Negative.    Cardiovascular: Negative.   Gastrointestinal: Negative.   Endocrine: Negative for polyphagia and polyuria.  Genitourinary: Negative.   Musculoskeletal:  Negative for gait problem and joint swelling.  Neurological:  Negative for speech difficulty and weakness.  Psychiatric/Behavioral: Negative.       Objective:    Physical Exam Vitals and nursing note reviewed.  Constitutional:      General: She is not in acute distress.    Appearance: Normal appearance. She is not ill-appearing, toxic-appearing or diaphoretic.  HENT:     Head: Normocephalic and atraumatic.     Right Ear: Tympanic membrane, ear canal and external ear normal.     Left Ear: Tympanic membrane, ear canal and external ear normal.     Mouth/Throat:     Mouth: Mucous membranes are moist.     Pharynx: Oropharynx is clear. No oropharyngeal exudate or posterior oropharyngeal erythema.  Eyes:     General: No scleral icterus.       Right eye: No discharge.        Left  eye: No discharge.     Extraocular Movements: Extraocular movements intact.     Conjunctiva/sclera: Conjunctivae normal.     Pupils: Pupils are equal, round, and reactive to light.  Cardiovascular:     Rate and Rhythm: Normal rate and regular rhythm.  Pulmonary:     Effort: Pulmonary effort is normal.     Breath sounds: Normal breath sounds.  Abdominal:     General: Bowel sounds are normal.  Musculoskeletal:     Cervical back: No rigidity or tenderness.  Lymphadenopathy:     Cervical: No cervical adenopathy.  Skin:    General: Skin is warm and dry.  Neurological:     Mental Status: She is alert and oriented to person, place, and time.  Psychiatric:        Mood and Affect: Mood normal.        Behavior: Behavior normal.    BP 130/78 (BP Location: Right Arm, Patient Position: Sitting, Cuff Size: Large)    Pulse 97    Temp (!) 97.2 F (36.2 C) (Temporal)    Ht 5\' 11"  (1.803 m)    Wt 272 lb (123.4 kg)    SpO2 99%    BMI 37.94 kg/m  Wt Readings from Last 3 Encounters:  05/07/21 272 lb (123.4 kg)  05/03/21 275 lb (124.7 kg)  02/19/21 275 lb (124.7 kg)     Health Maintenance Due  Topic Date Due   URINE MICROALBUMIN  Never done   HIV Screening  Never done   Hepatitis C Screening  Never done    There are no preventive care reminders to display for this patient.  Lab Results  Component Value Date   TSH 0.92 08/17/2018   Lab Results  Component Value Date   WBC 10.0 05/03/2021   HGB 10.4 (L) 05/03/2021   HCT 35.0 (L) 05/03/2021   MCV 82.0 05/03/2021   PLT 432 (H) 05/03/2021   Lab Results  Component Value Date   NA 136 05/03/2021   K 3.6 05/03/2021   CO2 23 05/03/2021   GLUCOSE 254 (H) 05/03/2021   BUN 9 05/03/2021   CREATININE 0.69 05/03/2021   BILITOT 0.4 08/17/2018   ALKPHOS 64 01/22/2018   AST 19 08/17/2018   ALT 22 08/17/2018   PROT 7.5 08/17/2018   ALBUMIN 4.0 01/22/2018   CALCIUM 9.6 05/03/2021   ANIONGAP 8 05/03/2021   GFR 111.89 11/23/2018   Lab  Results  Component Value Date   CHOL 176 08/17/2018   Lab Results  Component Value Date   HDL 36 (L) 08/17/2018   Lab Results  Component Value Date   LDLCALC 106 (H) 08/17/2018   Lab  Results  Component Value Date   TRIG 225 (H) 08/17/2018   Lab Results  Component Value Date   CHOLHDL 4.9 08/17/2018   Lab Results  Component Value Date   HGBA1C 10.4 (A) 01/18/2021      Assessment & Plan:   Problem List Items Addressed This Visit       Cardiovascular and Mediastinum   Migraine without aura   Relevant Medications   topiramate (TOPAMAX) 25 MG tablet     Other   ANEMIA   Relevant Medications   ferrous sulfate 325 (65 FE) MG tablet   Other Relevant Orders   CBC   Iron, TIBC and Ferritin Panel   Elevated LDL cholesterol level   Relevant Orders   Comprehensive metabolic panel   LDL cholesterol, direct   Lipid panel   Iron deficiency anemia due to chronic blood loss   Relevant Medications   ferrous sulfate 325 (65 FE) MG tablet   Other Relevant Orders   CBC   Iron, TIBC and Ferritin Panel   Healthcare maintenance - Primary   Relevant Orders   CBC   Comprehensive metabolic panel   Urinalysis, Routine w reflex microscopic    Meds ordered this encounter  Medications   ferrous sulfate 325 (65 FE) MG tablet    Sig: Take one twice daily    Dispense:  180 tablet    Refill:  1   topiramate (TOPAMAX) 25 MG tablet    Sig: Take 1 tablet (25 mg total) by mouth 2 (two) times daily.    Dispense:  180 tablet    Refill:  4    Follow-up: Return in about 1 year (around 05/07/2022), or Return fasting for above ordered blood work..  Discussed the importance of regular follow-up and that I need to see her at least on a yearly basis.  Will return fasting for above ordered blood work.  Information was given on health maintenance, anemia and disease prevention as well as preventing high cholesterol.   Libby Maw, MD

## 2021-05-10 MED ORDER — OMEPRAZOLE 40 MG PO CPDR: 40.0000 mg | DELAYED_RELEASE_CAPSULE | Freq: Every day | ORAL | 3 refills | Status: DC

## 2021-05-10 NOTE — Telephone Encounter (Signed)
Patient aware that Rx sent in  °

## 2021-05-10 NOTE — Telephone Encounter (Signed)
Spoke with patient who states that she would like something sent in for frequent heartburn? Please advice

## 2021-05-12 ENCOUNTER — Other Ambulatory Visit (INDEPENDENT_AMBULATORY_CARE_PROVIDER_SITE_OTHER): Payer: PRIVATE HEALTH INSURANCE

## 2021-05-12 ENCOUNTER — Other Ambulatory Visit: Payer: Self-pay

## 2021-05-12 DIAGNOSIS — Z0000 Encounter for general adult medical examination without abnormal findings: Secondary | ICD-10-CM

## 2021-05-12 DIAGNOSIS — E7800 Pure hypercholesterolemia, unspecified: Secondary | ICD-10-CM

## 2021-05-12 DIAGNOSIS — D500 Iron deficiency anemia secondary to blood loss (chronic): Secondary | ICD-10-CM

## 2021-05-12 LAB — URINALYSIS, ROUTINE W REFLEX MICROSCOPIC
Bilirubin Urine: NEGATIVE
Hgb urine dipstick: NEGATIVE
Ketones, ur: NEGATIVE
Leukocytes,Ua: NEGATIVE
Nitrite: NEGATIVE
Specific Gravity, Urine: >=1.03 — AB (ref 1.000–1.030)
Total Protein, Urine: NEGATIVE
Urine Glucose: NEGATIVE
Urobilinogen, UA: 0.2 (ref 0.0–1.0)
pH: 5.5 (ref 5.0–8.0)

## 2021-05-12 LAB — COMPREHENSIVE METABOLIC PANEL
ALT: 12 U/L (ref 0–35)
AST: 13 U/L (ref 0–37)
Albumin: 4.3 g/dL (ref 3.5–5.2)
Alkaline Phosphatase: 56 U/L (ref 39–117)
BUN: 12 mg/dL (ref 6–23)
CO2: 26 mEq/L (ref 19–32)
Calcium: 9.6 mg/dL (ref 8.4–10.5)
Chloride: 102 mEq/L (ref 96–112)
Creatinine, Ser: 0.82 mg/dL (ref 0.40–1.20)
GFR: 91.7 mL/min (ref >60.00)
Glucose, Bld: 209 mg/dL — ABNORMAL HIGH (ref 70–99)
Potassium: 3.8 mEq/L (ref 3.5–5.1)
Sodium: 134 mEq/L — ABNORMAL LOW (ref 135–145)
Total Bilirubin: 0.4 mg/dL (ref 0.2–1.2)
Total Protein: 7.8 g/dL (ref 6.0–8.3)

## 2021-05-12 LAB — LIPID PANEL
Cholesterol: 169 mg/dL (ref 0–200)
HDL: 37.1 mg/dL — ABNORMAL LOW (ref >39.00)
LDL Cholesterol: 99 mg/dL (ref 0–99)
NonHDL: 132.29
Total CHOL/HDL Ratio: 5
Triglycerides: 165 mg/dL — ABNORMAL HIGH (ref 0.0–149.0)
VLDL: 33 mg/dL (ref 0.0–40.0)

## 2021-05-12 LAB — LDL CHOLESTEROL, DIRECT: Direct LDL: 113 mg/dL

## 2021-05-12 LAB — CBC
HCT: 33.1 % — ABNORMAL LOW (ref 36.0–46.0)
Hemoglobin: 10.5 g/dL — ABNORMAL LOW (ref 12.0–15.0)
MCHC: 31.6 g/dL (ref 30.0–36.0)
MCV: 76.3 fl — ABNORMAL LOW (ref 78.0–100.0)
Platelets: 393 10*3/uL (ref 150.0–400.0)
RBC: 4.33 Mil/uL (ref 3.87–5.11)
RDW: 13.6 % (ref 11.5–15.5)
WBC: 7.9 10*3/uL (ref 4.0–10.5)

## 2021-05-13 LAB — IRON,TIBC AND FERRITIN PANEL
%SAT: 13 % (calc) — ABNORMAL LOW (ref 16–45)
Ferritin: 13 ng/mL — ABNORMAL LOW (ref 16–154)
Iron: 47 ug/dL (ref 40–190)
TIBC: 359 mcg/dL (calc) (ref 250–450)

## 2021-05-13 MED ORDER — ATORVASTATIN CALCIUM 10 MG PO TABS: 10.0000 mg | ORAL_TABLET | Freq: Every day | ORAL | 1 refills | Status: DC

## 2021-05-13 NOTE — Addendum Note (Signed)
Addended by: Jon Billings on: 05/13/2021 08:08 AM   Modules accepted: Orders

## 2021-05-14 ENCOUNTER — Encounter: Payer: Self-pay | Admitting: Family Medicine

## 2021-05-20 ENCOUNTER — Encounter: Payer: Self-pay | Admitting: Family Medicine

## 2021-05-20 ENCOUNTER — Ambulatory Visit (INDEPENDENT_AMBULATORY_CARE_PROVIDER_SITE_OTHER): Payer: Self-pay | Admitting: Family Medicine

## 2021-05-20 ENCOUNTER — Ambulatory Visit: Payer: PRIVATE HEALTH INSURANCE | Admitting: Endocrinology

## 2021-05-20 ENCOUNTER — Other Ambulatory Visit: Payer: Self-pay

## 2021-05-20 VITALS — BP 132/80 | HR 98 | Temp 97.4°F | Ht 71.0 in | Wt 273.0 lb

## 2021-05-20 DIAGNOSIS — B3731 Acute candidiasis of vulva and vagina: Secondary | ICD-10-CM

## 2021-05-20 MED ORDER — FLUCONAZOLE 150 MG PO TABS
150.0000 mg | ORAL_TABLET | Freq: Once | ORAL | 3 refills | Status: AC
Start: 2021-05-20 — End: 2021-05-20

## 2021-05-20 NOTE — Progress Notes (Signed)
? ?Established Patient Office Visit ? ?Subjective:  ?Patient ID: Dawn Shields, female    DOB: September 29, 1985  Age: 36 y.o. MRN: [id number] ? ?CC:  ?Chief Complaint  ?Patient presents with  ? Vaginitis  ?  Possible yeast infection 3-4 days, vaginal itch.   ? ? ?HPI ?Dawn Shields presents for evaluation of vaginal pruritus.  This started 3 days ago.  She denies vaginal sores, discharge, dysuria, frequency or urgency.  Denies nausea vomiting fever or chills.  No recent antibiotics.  She was started on Jardiance 3 months ago with a hemoglobin A1c 10.4.  Has follow-up with endocrinology today. ? ?Past Medical History:  ?Diagnosis Date  ? Diabetes mellitus without complication ([facility])   ? Migraine   ? ? ?No past surgical history on file. ? ?Family History  ?Problem Relation Age of Onset  ? Diabetes Mother   ? Heart disease Mother   ? ? ?Social History  ? ?Socioeconomic History  ? Marital status: Single  ?  Spouse name: Not on file  ? Number of children: Not on file  ? Years of education: Not on file  ? Highest education level: Not on file  ?Occupational History  ? Not on file  ?Tobacco Use  ? Smoking status: Never  ? Smokeless tobacco: Never  ?Vaping Use  ? Vaping Use: Never used  ?Substance and Sexual Activity  ? Alcohol use: No  ? Drug use: No  ? Sexual activity: Not Currently  ?  Birth control/protection: None  ?  Comment: virgin  ?Other Topics Concern  ? Not on file  ?Social History Narrative  ? Not on file  ? ?Social Determinants of Health  ? ?Financial Resource Strain: Not on file  ?Food Insecurity: Not on file  ?Transportation Needs: Not on file  ?Physical Activity: Not on file  ?Stress: Not on file  ?Social Connections: Not on file  ?Intimate Partner Violence: Not on file  ? ? ?Outpatient Medications Prior to Visit  ?Medication Sig Dispense Refill  ? atorvastatin (LIPITOR) 10 MG tablet Take 1 tablet (10 mg total) by mouth daily. 90 tablet 1  ? empagliflozin (JARDIANCE) 25 MG TABS tablet Take 1 tablet (25 mg total)  by mouth daily before breakfast. 90 tablet 3  ? ferrous sulfate 325 (65 FE) MG tablet Take one twice daily 180 tablet 1  ? glimepiride (AMARYL) 4 MG tablet Take 1 tablet (4 mg total) by mouth daily before breakfast. 90 tablet 3  ? glucose blood (CONTOUR NEXT TEST) test strip 1 each by Other route daily. And lancets 1/day 100 each 3  ? metFORMIN (GLUCOPHAGE-XR) 500 MG 24 hr tablet Take 2 tablets (1,000 mg total) by mouth 2 (two) times daily. 360 tablet 3  ? minocycline (MINOCIN) 100 MG capsule Take 100 mg by mouth daily.    ? omeprazole (PRILOSEC) 40 MG capsule Take 1 capsule (40 mg total) by mouth daily. 30 capsule 3  ? topiramate (TOPAMAX) 25 MG tablet Take 1 tablet (25 mg total) by mouth 2 (two) times daily. 180 tablet 4  ? ?No facility-administered medications prior to visit.  ? ? ?Allergies  ?Allergen Reactions  ? Erythromycin Rash  ? ? ?ROS ?Review of Systems  ?Constitutional:  Negative for chills, diaphoresis, fatigue, fever and unexpected weight change.  ?Respiratory: Negative.    ?Cardiovascular: Negative.   ?Gastrointestinal:  Negative for abdominal pain, nausea and vomiting.  ?Genitourinary:  Negative for decreased urine volume, dysuria, frequency, pelvic pain, urgency, vaginal bleeding, vaginal discharge  and vaginal pain.  ? ?  ?Objective:  ?  ?Physical Exam ?Constitutional:   ?   Appearance: Normal appearance.  ?HENT:  ?   Head: Normocephalic and atraumatic.  ?Eyes:  ?   General: No scleral icterus.    ?   Right eye: No discharge.     ?   Left eye: No discharge.  ?   Extraocular Movements: Extraocular movements intact.  ?   Conjunctiva/sclera: Conjunctivae normal.  ?Cardiovascular:  ?   Rate and Rhythm: Normal rate and regular rhythm.  ?Pulmonary:  ?   Effort: Pulmonary effort is normal.  ?   Breath sounds: Normal breath sounds.  ?Abdominal:  ?   General: Bowel sounds are normal.  ?Neurological:  ?   Mental Status: She is alert and oriented to person, place, and time.  ?Psychiatric:     ?   Mood and  Affect: Mood normal.     ?   Behavior: Behavior normal.  ? ? ?BP 132/80 (BP Location: Right Arm, Patient Position: Sitting, Cuff Size: Large)   Pulse 98   Temp (!) 97.4 ?F (36.3 ?C) (Temporal)   Ht 5\' 11"  (1.803 m)   Wt 273 lb (123.8 kg)   SpO2 100%   BMI 38.08 kg/m?  ?Wt Readings from Last 3 Encounters:  ?05/20/21 273 lb (123.8 kg)  ?05/07/21 272 lb (123.4 kg)  ?05/03/21 275 lb (124.7 kg)  ? ? ? ?Health Maintenance Due  ?Topic Date Due  ? URINE MICROALBUMIN  Never done  ? HIV Screening  Never done  ? Hepatitis C Screening  Never done  ? ? ?There are no preventive care reminders to display for this patient. ? ?Lab Results  ?Component Value Date  ? TSH 0.92 08/17/2018  ? ?Lab Results  ?Component Value Date  ? WBC 7.9 05/12/2021  ? HGB 10.5 (L) 05/12/2021  ? HCT 33.1 (L) 05/12/2021  ? MCV 76.3 (L) 05/12/2021  ? PLT 393.0 05/12/2021  ? ?Lab Results  ?Component Value Date  ? NA 134 (L) 05/12/2021  ? K 3.8 05/12/2021  ? CO2 26 05/12/2021  ? GLUCOSE 209 (H) 05/12/2021  ? BUN 12 05/12/2021  ? CREATININE 0.82 05/12/2021  ? BILITOT 0.4 05/12/2021  ? ALKPHOS 56 05/12/2021  ? AST 13 05/12/2021  ? ALT 12 05/12/2021  ? PROT 7.8 05/12/2021  ? ALBUMIN 4.3 05/12/2021  ? CALCIUM 9.6 05/12/2021  ? ANIONGAP 8 05/03/2021  ? GFR 91.70 05/12/2021  ? ?Lab Results  ?Component Value Date  ? CHOL 169 05/12/2021  ? ?Lab Results  ?Component Value Date  ? HDL 37.10 (L) 05/12/2021  ? ?Lab Results  ?Component Value Date  ? LDLCALC 99 05/12/2021  ? ?Lab Results  ?Component Value Date  ? TRIG 165.0 (H) 05/12/2021  ? ?Lab Results  ?Component Value Date  ? CHOLHDL 5 05/12/2021  ? ?Lab Results  ?Component Value Date  ? HGBA1C 10.4 (A) 01/18/2021  ? ? ?  ?Assessment & Plan:  ? ?Problem List Items Addressed This Visit   ? ?  ? Genitourinary  ? Yeast vaginitis - Primary  ? Relevant Medications  ? fluconazole (DIFLUCAN) 150 MG tablet  ? ? ?Meds ordered this encounter  ?Medications  ? fluconazole (DIFLUCAN) 150 MG tablet  ?  Sig: Take 1 tablet (150  mg total) by mouth once for 1 dose. And repeat once in 1 week.  ?  Dispense:  2 tablet  ?  Refill:  3  ? ? ?Follow-up:  Return if symptoms worsen or fail to improve.  ?Discussed the use of association with diabetes and yeast vaginitis.  Discussed mechanism of action for Jardiance.  Will return if symptoms or not relieved.  ? ?Mliss Sax, MD ?

## 2021-05-29 ENCOUNTER — Other Ambulatory Visit: Payer: Self-pay | Admitting: Family Medicine

## 2021-05-29 DIAGNOSIS — K219 Gastro-esophageal reflux disease without esophagitis: Secondary | ICD-10-CM

## 2021-06-21 ENCOUNTER — Other Ambulatory Visit: Payer: Self-pay

## 2021-06-21 ENCOUNTER — Encounter: Payer: Self-pay | Admitting: Nurse Practitioner

## 2021-06-21 ENCOUNTER — Ambulatory Visit (INDEPENDENT_AMBULATORY_CARE_PROVIDER_SITE_OTHER): Payer: 59 | Admitting: Nurse Practitioner

## 2021-06-21 VITALS — BP 124/82 | Ht 71.0 in | Wt 269.0 lb

## 2021-06-21 DIAGNOSIS — N898 Other specified noninflammatory disorders of vagina: Secondary | ICD-10-CM | POA: Diagnosis not present

## 2021-06-21 DIAGNOSIS — B3731 Acute candidiasis of vulva and vagina: Secondary | ICD-10-CM

## 2021-06-21 DIAGNOSIS — Z01419 Encounter for gynecological examination (general) (routine) without abnormal findings: Secondary | ICD-10-CM | POA: Diagnosis not present

## 2021-06-21 DIAGNOSIS — R922 Inconclusive mammogram: Secondary | ICD-10-CM | POA: Diagnosis not present

## 2021-06-21 LAB — WET PREP FOR TRICH, YEAST, CLUE

## 2021-06-21 MED ORDER — FLUCONAZOLE 150 MG PO TABS: ORAL_TABLET | ORAL | 0 refills | Status: DC

## 2021-06-21 MED ORDER — FLUCONAZOLE 150 MG PO TABS: 150.0000 mg | ORAL_TABLET | ORAL | 0 refills | Status: DC | PRN

## 2021-06-21 NOTE — Telephone Encounter (Signed)
Incoming fax from [employer] re: missing/illegible information on Rx- Sig Code: Please provide more specific instruction/frequency for insurance billing (Daily, q3days...) ? ?They stated, pls send new Rx, if appropriate.  ?

## 2021-06-21 NOTE — Progress Notes (Signed)
? ?  Dawn Shields July 16, 1985 [id number] ? ? ?History:  36 y.o. G0 presents for annual exam. Monthly cycles. DM, HLD, migraines managed by PCP. She has been having frequent yeast infections since starting Jardiance. She has vaginal itching today. She noticed a lumpy area in left outer breast the last few days.  ? ?Gynecologic History ?Patient's last menstrual period was 06/07/2021. ?Period Cycle (Days): 28 ?Period Duration (Days): 6 ?Period Pattern: Regular ?Menstrual Flow: Heavy, Moderate ?Dysmenorrhea: None ?Contraception/Family planning: abstinence ?Sexually active: No, never has been ? ?Health Maintenance ?Last Pap: 05/15/2019. Results were: Normal, 5-year repeat ?Last mammogram: Not indicated ?Last colonoscopy: Not indicated ?Last Dexa: Not indicated ? ?Past medical history, past surgical history, family history and social history were all reviewed and documented in the EPIC chart. 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  ? ?ROS:  A ROS was performed and pertinent positives and negatives are included. ? ?Exam: ? ?Vitals:  ? 06/21/21 1343  ?BP: 124/82  ?Weight: 269 lb (122 kg)  ?Height: 5\' 11"  (1.803 m)  ? ?Body mass index is 37.52 kg/m?. ? ?General appearance:  Normal ?Thyroid:  Symmetrical, normal in size, without palpable masses or nodularity. ?Respiratory ? Auscultation:  Clear without wheezing or rhonchi ?Cardiovascular ? Auscultation:  Regular rate, without rubs, murmurs or gallops ? Edema/varicosities:  Not grossly evident ?Abdominal ? Soft,nontender, without masses, guarding or rebound. ? Liver/spleen:  No organomegaly noted ? Hernia:  None appreciated ? Skin ? Inspection:  Grossly normal ?Breasts: Examined lying and sitting.  ? Right: Without masses, retractions, nipple discharge or axillary adenopathy.Scattered areas of lumpiness, likely dense tissue ? ? Left: Without masses, retractions, nipple discharge or axillary adenopathy. Scattered areas of lumpiness, likely dense tissue ?Genitourinary  ? Inguinal/mons:  Normal  without inguinal adenopathy ? External genitalia:  Normal appearing vulva with no masses, tenderness, or lesions ? BUS/Urethra/Skene's glands:  Normal ? Vagina:  Normal appearing with normal color and discharge, no lesions ? Cervix:  Normal appearing without discharge or lesions ? Uterus:  Normal in size, shape and contour.  Midline and mobile, nontender ? Adnexa/parametria:   ?  Rt: Normal in size, without masses or tenderness. ?  Lt: Normal in size, without masses or tenderness. ? Anus and perineum: Normal ? Digital rectal exam: Not indicated ? ?Patient informed chaperone available to be present for breast and pelvic exam. Patient has requested no chaperone to be present. Patient has been advised what will be completed during breast and pelvic exam.  ? ?Assessment/Plan:  36 y.o. G0 for annual exam.  ? ?Well female exam with routine gynecological exam - Education provided on SBEs, importance of preventative screenings, current guidelines, high calcium diet, regular exercise, and multivitamin daily. Labs with PCP.  ? ?Vaginal itching - Plan: WET PREP FOR [place], YEAST, CLUE. Negative wet prep and exam today.  ? ?Vaginal candidiasis - Plan: fluconazole (DIFLUCAN) 150 MG tablet as needed at first sign of infection. #15 provided. She has been getting recurrent yeast infections d/t Jardiance use.  ? ?Dense breasts - patient has bilateral dense breast tissue. Area of concern in outer left breast that she noted recently feels fibrocystic in nature. She will monitor and if no resolution in 2-4 weeks we will send for imaging. She is agreeable.  ? ?Screening for cervical cancer - Normal Pap history.  Will repeat at 5-year interval per guidelines. ? ?Return in 1 year for annual.  ? ? ? ?Tamela Gammon DNP, 2:08 PM 06/21/2021 ? ?

## 2021-06-28 ENCOUNTER — Ambulatory Visit: Payer: 59 | Admitting: Nurse Practitioner

## 2021-06-28 DIAGNOSIS — Z0289 Encounter for other administrative examinations: Secondary | ICD-10-CM

## 2021-06-28 NOTE — Progress Notes (Deleted)
? ?  Acute Office Visit ? ?Subjective:  ? ? Patient ID: Dawn Shields, female    DOB: 22-Sep-1985, 36 y.o.   MRN: [id number] ? ? ?HPI ?36 y.o. presents today to discuss contraception. H/O migraines without aura.  ? ? ?Review of Systems ? ?   ?Objective:  ?  ?Physical Exam ? ?LMP 06/07/2021  ?Wt Readings from Last 3 Encounters:  ?06/21/21 269 lb (122 kg)  ?05/20/21 273 lb (123.8 kg)  ?05/07/21 272 lb (123.4 kg)  ? ? ?   ? ?Patient informed chaperone available to be present for breast and pelvic exam. Patient has requested no chaperone to be present. Patient has been advised what will be completed during breast and pelvic exam.  ? ?Assessment & Plan:  ? ?Problem List Items Addressed This Visit   ?None ? ? ? ? ? ? ?Tamela Gammon DNP, 9:54 AM 06/28/2021 ? ?

## 2021-07-08 ENCOUNTER — Ambulatory Visit: Payer: 59 | Admitting: Nurse Practitioner

## 2021-07-12 ENCOUNTER — Ambulatory Visit: Payer: 59 | Admitting: Nurse Practitioner

## 2021-07-12 NOTE — Progress Notes (Deleted)
   Acute Office Visit  Subjective:    Patient ID: Marlise Eves, female    DOB: 23-Nov-1985, 36 y.o.   MRN: [id number]   HPI 36 y.o. presents today to discuss contraception. H/O migraines without aura.    Review of Systems     Objective:    Physical Exam  There were no vitals taken for this visit. Wt Readings from Last 3 Encounters:  06/21/21 269 lb (122 kg)  05/20/21 273 lb (123.8 kg)  05/07/21 272 lb (123.4 kg)        Patient informed chaperone available to be present for breast and pelvic exam. Patient has requested no chaperone to be present. Patient has been advised what will be completed during breast and pelvic exam.   Assessment & Plan:   Problem List Items Addressed This Visit   None       [name], 11:12 AM 07/12/2021

## 2021-07-13 ENCOUNTER — Ambulatory Visit (INDEPENDENT_AMBULATORY_CARE_PROVIDER_SITE_OTHER): Payer: 59 | Admitting: Nurse Practitioner

## 2021-07-13 ENCOUNTER — Encounter: Payer: Self-pay | Admitting: Nurse Practitioner

## 2021-07-13 VITALS — BP 124/80

## 2021-07-13 DIAGNOSIS — Z3009 Encounter for other general counseling and advice on contraception: Secondary | ICD-10-CM

## 2021-07-13 NOTE — Progress Notes (Signed)
? ?  Acute Office Visit ? ?Subjective:  ? ? Patient ID: Dawn Shields, female    DOB: 07-14-1985, 36 y.o.   MRN: [id number] ? ? ?HPI ?36 y.o. presents today to discuss contraception per dermatology request. She is about to start Accutane and contraception is required. She is interested in Nexplanon. Never been sexually active. H/O migraines without aura.  ? ? ?Review of Systems  ?Constitutional: Negative.   ?Genitourinary: Negative.   ? ?   ?Objective:  ?  ?Physical Exam ?Constitutional:   ?   Appearance: Normal appearance.  ?GU: not indicated ? ?BP 124/80   LMP 07/03/2021  ?Wt Readings from Last 3 Encounters:  ?06/21/21 269 lb (122 kg)  ?05/20/21 273 lb (123.8 kg)  ?05/07/21 272 lb (123.4 kg)  ? ? ?   ? ?Patient informed chaperone available to be present for breast and pelvic exam. Patient has requested no chaperone to be present. Patient has been advised what will be completed during breast and pelvic exam.  ? ?Assessment & Plan:  ? ?Problem List Items Addressed This Visit   ?None ?Visit Diagnoses   ? ? General counselling and advice on contraception    -  Primary  ? Relevant Orders  ? Insertion of implanon rod  ? ?  ? ?Plan: Contraceptive options were reviewed, including hormonal methods, both combination (pill, patch, vaginal ring) and progesterone-only (pill, Depo Provera and Nexplanon), intrauterine devices (Mirena, Skyla, Paraguard, and [place]), Phexxi, barrier methods (condoms, diaphragm) and female/female sterilization. The mechanisms, risks, benefits and side effects of all methods were discussed. She would like Nexplanon. She will schedule appointment for insertion.  ? ? ? ? ? ?[name], 11:24 AM 07/13/2021 ? ? ?

## 2021-07-16 ENCOUNTER — Ambulatory Visit: Payer: PRIVATE HEALTH INSURANCE | Admitting: Endocrinology

## 2021-07-22 ENCOUNTER — Ambulatory Visit: Payer: 59 | Admitting: Endocrinology

## 2021-07-22 VITALS — BP 138/86 | HR 89 | Ht 71.0 in | Wt 272.8 lb

## 2021-07-22 DIAGNOSIS — E1165 Type 2 diabetes mellitus with hyperglycemia: Secondary | ICD-10-CM | POA: Diagnosis not present

## 2021-07-22 DIAGNOSIS — E0865 Diabetes mellitus due to underlying condition with hyperglycemia: Secondary | ICD-10-CM

## 2021-07-22 LAB — POCT GLYCOSYLATED HEMOGLOBIN (HGB A1C): Hemoglobin A1C: 7.5 % — AB (ref 4.0–5.6)

## 2021-07-22 MED ORDER — EMPAGLIFLOZIN 25 MG PO TABS: 25.0000 mg | ORAL_TABLET | Freq: Every day | ORAL | 3 refills | Status: DC

## 2021-07-22 MED ORDER — METFORMIN HCL ER 500 MG PO TB24: 1000.0000 mg | ORAL_TABLET | Freq: Two times a day (BID) | ORAL | 3 refills | Status: DC

## 2021-07-22 MED ORDER — GLIMEPIRIDE 4 MG PO TABS
4.0000 mg | ORAL_TABLET | Freq: Every day | ORAL | 3 refills | Status: DC
Start: 2021-07-22 — End: 2022-02-25

## 2021-07-22 NOTE — Patient Instructions (Signed)
Please continue the same metformin and glimepiride, and: I have sent a prescription to your pharmacy, to add Jardiance. check your blood sugar once a day.  vary the time of day when you check, between before the 3 meals, and at bedtime.  also check if you have symptoms of your blood sugar being too high or too low.  please keep a record of the readings and bring it to your next appointment here (or you can bring the meter itself).  You can write it on any piece of paper.  please call us sooner if your blood sugar goes below 70, or if you have a lot of readings over 200.   Please come back for a follow-up appointment in 3 months.   

## 2021-07-22 NOTE — Progress Notes (Signed)
? ?Subjective:  ? ? Patient ID: Dawn Shields, female    DOB: [birth year], 36 y.o.   MRN: [id number] ? ?HPI ?Pt returns for f/u of diabetes mellitus: ?DM type: 2 ?Dx'ed: 2020 ?Complications: none ?Therapy: 3 oral meds ?GDM: G0 ?DKA: never ?Severe hypoglycemia: never ?Pancreatitis: never ?Pancreatic imaging: never ?SDOH: She works 8AM-5PM ?Other: she took insulin for a brief time in 2020; she did not tolerate Rybelsus (lightheadedness); Pt says she is not at risk for pregnancy.   ?Interval history: She says cbg's vary from 189-195.  pt states she feels well in general.   ?Past Medical History:  ?Diagnosis Date  ? Diabetes mellitus without complication ([place])   ? Migraine   ? ? ?No past surgical history on file. ? ?Social History  ? ?Socioeconomic History  ? Marital status: Single  ?  Spouse name: Not on file  ? Number of children: Not on file  ? Years of education: Not on file  ? Highest education level: Not on file  ?Occupational History  ? Not on file  ?Tobacco Use  ? Smoking status: Never  ? Smokeless tobacco: Never  ?Vaping Use  ? Vaping Use: Never used  ?Substance and Sexual Activity  ? Alcohol use: No  ? Drug use: No  ? Sexual activity: Never  ?  Birth control/protection: None  ?  Comment: virgin  ?Other Topics Concern  ? Not on file  ?Social History Narrative  ? Not on file  ? ?Social Determinants of Health  ? ?Financial Resource Strain: Not on file  ?Food Insecurity: Not on file  ?Transportation Needs: Not on file  ?Physical Activity: Not on file  ?Stress: Not on file  ?Social Connections: Not on file  ?Intimate Partner Violence: Not on file  ? ? ?Current Outpatient Medications on File Prior to Visit  ?Medication Sig Dispense Refill  ? atorvastatin (LIPITOR) 10 MG tablet Take 1 tablet (10 mg total) by mouth daily. 90 tablet 1  ? ferrous sulfate 325 (65 FE) MG tablet Take one twice daily 180 tablet 1  ? fluconazole (DIFLUCAN) 150 MG tablet Use as needed at first sign of yeast infection 15 tablet 0  ? glucose  blood (CONTOUR NEXT TEST) test strip 1 each by Other route daily. And lancets 1/day 100 each 3  ? minocycline (MINOCIN) 100 MG capsule Take 100 mg by mouth daily.    ? omeprazole (PRILOSEC) 40 MG capsule TAKE 1 CAPSULE BY MOUTH EVERY DAY 90 capsule 1  ? topiramate (TOPAMAX) 25 MG tablet Take 1 tablet (25 mg total) by mouth 2 (two) times daily. 180 tablet 4  ? ?No current facility-administered medications on file prior to visit.  ? ? ?Allergies  ?Allergen Reactions  ? Erythromycin Rash  ? ? ?Family History  ?Problem Relation Age of Onset  ? Diabetes Mother   ? Heart disease Mother   ? ? ?BP 138/86 (BP Location: Left Arm, Patient Position: Sitting, Cuff Size: Normal)   Pulse 89   Ht 5\' 11"  (1.803 m)   Wt 272 lb 12.8 oz (123.7 kg)   LMP 07/03/2021   SpO2 99%   BMI 38.05 kg/m?  ? ? ?Review of Systems ?She denies hypoglycemia.   ?   ?Objective:  ? Physical Exam ?VITAL SIGNS:  See vs page ?GENERAL: no distress ?EXT: no leg edema ? ?A1c=7.5% ? ?   ?Assessment & Plan:  ?Type 2 DM: Uncontrolled. ? ?Patient Instructions  ?Please continue the same metformin and glimepiride, and: ?I  have sent a prescription to your pharmacy, to add Jardiance. ?check your blood sugar once a day.  vary the time of day when you check, between before the 3 meals, and at bedtime.  also check if you have symptoms of your blood sugar being too high or too low.  please keep a record of the readings and bring it to your next appointment here (or you can bring the meter itself).  You can write it on any piece of paper.  please call us sooner if your blood sugar goes below 70, or if you have a lot of readings over 200.   ?Please come back for a follow-up appointment in 3 months.   ? ? ?

## 2021-08-03 ENCOUNTER — Ambulatory Visit: Payer: 59 | Admitting: Nurse Practitioner

## 2021-08-04 ENCOUNTER — Ambulatory Visit: Payer: 59 | Admitting: Nurse Practitioner

## 2021-10-05 ENCOUNTER — Ambulatory Visit (INDEPENDENT_AMBULATORY_CARE_PROVIDER_SITE_OTHER): Payer: 59 | Admitting: Family Medicine

## 2021-10-05 ENCOUNTER — Encounter: Payer: Self-pay | Admitting: Family Medicine

## 2021-10-05 VITALS — BP 116/80 | HR 94 | Temp 96.9°F | Ht 71.0 in | Wt 260.0 lb

## 2021-10-05 DIAGNOSIS — U071 COVID-19: Secondary | ICD-10-CM

## 2021-10-05 MED ORDER — PSEUDOEPHEDRINE-GUAIFENESIN ER 60-600 MG PO TB12: 1.0000 | ORAL_TABLET | Freq: Two times a day (BID) | ORAL | 1 refills | Status: AC

## 2021-10-05 MED ORDER — NIRMATRELVIR/RITONAVIR (PAXLOVID)TABLET: 3.0000 | ORAL_TABLET | Freq: Two times a day (BID) | ORAL | 0 refills | Status: AC

## 2021-10-05 NOTE — Progress Notes (Signed)
Established Patient Office Visit  Subjective   Patient ID: Dawn Shields, female    DOB: January 11, 1986  Age: 36 y.o. MRN: [id number]  Chief Complaint  Patient presents with   Sore Throat    Fever body chill 2 days ago, head congestion, sore throat tested positive for covid yesterday.     Sore Throat  Associated symptoms include congestion, coughing and headaches. Pertinent negatives include no abdominal pain or shortness of breath.   4-day history of URI symptoms with headache, nasal congestion postnasal drip sore throat cough myalgias arthralgias.  Tested positive for COVID yesterday.  Denies wheezing or difficulty breathing.  No asthma history.  She does not smoke.  History of type 2 diabetes.    Review of Systems  Constitutional:  Negative for chills, diaphoresis, malaise/fatigue and weight loss.  HENT:  Positive for congestion and sore throat.   Eyes: Negative.  Negative for blurred vision and double vision.  Respiratory:  Positive for cough. Negative for sputum production, shortness of breath and wheezing.   Cardiovascular:  Negative for chest pain.  Gastrointestinal:  Negative for abdominal pain.  Genitourinary: Negative.   Musculoskeletal:  Positive for falls and myalgias.  Neurological:  Positive for headaches. Negative for speech change, loss of consciousness and weakness.  Psychiatric/Behavioral: Negative.        Objective:     BP 116/80 (BP Location: Right Arm, Patient Position: Sitting, Cuff Size: Large)   Pulse 94   Temp (!) 96.9 F (36.1 C) (Temporal)   Ht 5\' 11"  (1.803 m)   Wt 260 lb (117.9 kg)   SpO2 98%   BMI 36.26 kg/m    Physical Exam Constitutional:      General: She is not in acute distress.    Appearance: Normal appearance. She is not ill-appearing, toxic-appearing or diaphoretic.  HENT:     Head: Normocephalic and atraumatic.     Right Ear: Tympanic membrane, ear canal and external ear normal.     Left Ear: Tympanic membrane, ear canal and  external ear normal.     Mouth/Throat:     Mouth: Mucous membranes are moist.     Pharynx: Oropharynx is clear. No oropharyngeal exudate or posterior oropharyngeal erythema.  Eyes:     General: No scleral icterus.       Right eye: No discharge.        Left eye: No discharge.     Extraocular Movements: Extraocular movements intact.     Conjunctiva/sclera: Conjunctivae normal.     Pupils: Pupils are equal, round, and reactive to light.  Cardiovascular:     Rate and Rhythm: Normal rate and regular rhythm.  Pulmonary:     Effort: Pulmonary effort is normal. No respiratory distress.     Breath sounds: Normal breath sounds. No wheezing or rales.  Abdominal:     General: Bowel sounds are normal.     Tenderness: There is no abdominal tenderness. There is no guarding.  Musculoskeletal:     Cervical back: No rigidity or tenderness.  Skin:    General: Skin is warm and dry.  Neurological:     Mental Status: She is alert and oriented to person, place, and time.  Psychiatric:        Mood and Affect: Mood normal.        Behavior: Behavior normal.      No results found for any visits on 10/05/21.    The ASCVD Risk score (Arnett DK, et al., 2019) failed to calculate  for the following reasons:   The 2019 ASCVD risk score is only valid for ages 55 to 49    Assessment & Plan:   Problem List Items Addressed This Visit   None Visit Diagnoses     COVID-19    -  Primary   Relevant Medications   nirmatrelvir/ritonavir EUA (PAXLOVID) 20 x 150 MG & 10 x 100MG  TABS   pseudoephedrine-guaifenesin (MUCINEX D) 60-600 MG 12 hr tablet       Return in about 1 week (around 10/12/2021), or if symptoms worsen or fail to improve.  May use nasal saline.  Continue Tylenol and Advil.  Follow-up in 1 week if not improving. Explained that Paxlovid is an experimental drug and has not been approved by the FDA.  Having said that it has been helping a great deal of people.  With chronic illness qualifies for  treatment. 10/14/2021, MD

## 2021-11-03 ENCOUNTER — Ambulatory Visit (INDEPENDENT_AMBULATORY_CARE_PROVIDER_SITE_OTHER): Payer: 59 | Admitting: Nurse Practitioner

## 2021-11-03 ENCOUNTER — Encounter: Payer: Self-pay | Admitting: Nurse Practitioner

## 2021-11-03 VITALS — BP 108/68 | HR 116

## 2021-11-03 DIAGNOSIS — N898 Other specified noninflammatory disorders of vagina: Secondary | ICD-10-CM | POA: Diagnosis not present

## 2021-11-03 DIAGNOSIS — B3731 Acute candidiasis of vulva and vagina: Secondary | ICD-10-CM

## 2021-11-03 DIAGNOSIS — Z3009 Encounter for other general counseling and advice on contraception: Secondary | ICD-10-CM | POA: Diagnosis not present

## 2021-11-03 LAB — WET PREP FOR TRICH, YEAST, CLUE

## 2021-11-03 MED ORDER — FLUCONAZOLE 150 MG PO TABS: 150.0000 mg | ORAL_TABLET | Freq: Every day | ORAL | 0 refills | Status: DC

## 2021-11-03 NOTE — Progress Notes (Signed)
   Acute Office Visit  Subjective:    Patient ID: Dawn Shields, female    DOB: 10/21/1985, 36 y.o.   MRN: [id number]   HPI 36 y.o. presents today for vaginal itching and discharge. Itching is mostly external. Reports history of recurrent yeast infections since starting Jardiance. Not sexually active. We discussed contraception at previous visit and she is interested in Nexplanon. She plans to start Accutane once she has contraception.   Review of Systems  Constitutional: Negative.   Genitourinary:  Positive for vaginal discharge.       Vaginal itching       Objective:    Physical Exam Constitutional:      Appearance: Normal appearance.  Genitourinary:    Vagina: Normal.     Cervix: Normal.     Comments: Vulvar erythema present    BP 108/68   Pulse (!) 116   LMP 10/02/2021 (Approximate)   SpO2 98%  Wt Readings from Last 3 Encounters:  10/05/21 260 lb (117.9 kg)  07/22/21 272 lb 12.8 oz (123.7 kg)  06/21/21 269 lb (122 kg)        Patient informed chaperone available to be present for breast and/or pelvic exam. Patient has requested no chaperone to be present. Patient has been advised what will be completed during breast and pelvic exam.   Wet prep + yeast  Assessment & Plan:   Problem List Items Addressed This Visit   None Visit Diagnoses     Vaginal candidiasis    -  Primary   Relevant Medications   fluconazole (DIFLUCAN) 150 MG tablet   Vaginal itching       Relevant Orders   WET PREP FOR TRICH, YEAST, CLUE   General counseling and advice on contraceptive management       Relevant Orders   Insertion of implanon rod      Plan: Wet prep positive for yeast - Diflucan 150 mg today and repeat in 3 days for total of 2 doses. Refills provided due to recurrences. Will schedule appointment for Nexplanon insertion.      [name], 12:22 PM 11/03/2021

## 2021-11-05 ENCOUNTER — Other Ambulatory Visit: Payer: Self-pay | Admitting: Family Medicine

## 2021-11-05 DIAGNOSIS — E7800 Pure hypercholesterolemia, unspecified: Secondary | ICD-10-CM

## 2021-11-15 ENCOUNTER — Ambulatory Visit: Payer: 59 | Admitting: Nurse Practitioner

## 2021-11-15 DIAGNOSIS — Z01812 Encounter for preprocedural laboratory examination: Secondary | ICD-10-CM

## 2021-11-17 ENCOUNTER — Ambulatory Visit: Payer: Self-pay | Admitting: Nurse Practitioner

## 2021-11-17 DIAGNOSIS — Z01812 Encounter for preprocedural laboratory examination: Secondary | ICD-10-CM

## 2021-11-17 DIAGNOSIS — Z0289 Encounter for other administrative examinations: Secondary | ICD-10-CM

## 2021-12-15 ENCOUNTER — Ambulatory Visit (INDEPENDENT_AMBULATORY_CARE_PROVIDER_SITE_OTHER): Payer: Commercial Managed Care - HMO | Admitting: Nurse Practitioner

## 2021-12-15 VITALS — BP 114/66

## 2021-12-15 DIAGNOSIS — Z30017 Encounter for initial prescription of implantable subdermal contraceptive: Secondary | ICD-10-CM | POA: Diagnosis not present

## 2021-12-15 DIAGNOSIS — Z01812 Encounter for preprocedural laboratory examination: Secondary | ICD-10-CM

## 2021-12-15 DIAGNOSIS — B3731 Acute candidiasis of vulva and vagina: Secondary | ICD-10-CM

## 2021-12-15 LAB — PREGNANCY, URINE: Preg Test, Ur: NEGATIVE

## 2021-12-15 MED ORDER — FLUCONAZOLE 150 MG PO TABS: 150.0000 mg | ORAL_TABLET | Freq: Every day | ORAL | 0 refills | Status: DC

## 2021-12-15 NOTE — Progress Notes (Signed)
36 y.o. G0P0000 female presents for Nexplanon insertion.  She has been counseled about alternative types of contraception and has decided this long acting method is the best for her.  Procedure, risks and benefits have all been explained.  She has the following questions today:  None.     LMP:  12/10/21   After all questions were answered, consent was obtained.    Past Medical History:  Diagnosis Date   Diabetes mellitus without complication ([facility])    Migraine     No past surgical history on file.  Current Outpatient Medications on File Prior to Visit  Medication Sig Dispense Refill   atorvastatin (LIPITOR) 10 MG tablet TAKE 1 TABLET BY MOUTH EVERY DAY 90 tablet 1   empagliflozin (JARDIANCE) 25 MG TABS tablet Take 1 tablet (25 mg total) by mouth daily before breakfast. 90 tablet 3   etonogestrel (NEXPLANON) 68 MG IMPL implant 1 each by Subdermal route once. Inserted 12/15/21 Left ARM     ferrous sulfate 325 (65 FE) MG tablet Take one twice daily 180 tablet 1   fluconazole (DIFLUCAN) 150 MG tablet Take 1 tablet (150 mg total) by mouth daily. Take 1 tablet as needed at first sign of infection 30 tablet 0   glimepiride (AMARYL) 4 MG tablet Take 1 tablet (4 mg total) by mouth daily before breakfast. 90 tablet 3   glucose blood (CONTOUR NEXT TEST) test strip 1 each by Other route daily. And lancets 1/day 100 each 3   metFORMIN (GLUCOPHAGE-XR) 500 MG 24 hr tablet Take 2 tablets (1,000 mg total) by mouth 2 (two) times daily. 360 tablet 3   omeprazole (PRILOSEC) 40 MG capsule TAKE 1 CAPSULE BY MOUTH EVERY DAY 90 capsule 1   pseudoephedrine-guaifenesin (MUCINEX D) 60-600 MG 12 hr tablet Take 1 tablet by mouth every 12 (twelve) hours. 10 tablet 1   topiramate (TOPAMAX) 25 MG tablet Take 1 tablet (25 mg total) by mouth 2 (two) times daily. 180 tablet 4   No current facility-administered medications on file prior to visit.   Allergies  Allergen Reactions   Erythromycin Rash    Vitals:    12/15/21 1356  BP: 114/66   Physical Exam  Procedure: Patient placed supine on exam table with her left arm flexed at the elbow. The location for insertion site was identified 8-10 cm from epicondyle and 3-5 cm posterior.  Area cleansed with Betadine x 3.  Insertion site and path of insertion was anesthetized with 1% Lidocaine without epinephrine, 1.5 cc total.  Using Nexplanon device (and after confirming presence of rod in device), skin was pierced and then elevated along insertion path, passing device just under the skin.  Rod released and device inserted.  Rod palpated easily.  Steri-strips applied and a pressure bandage was place over the site.  Entire procedure performed with sterile technique.  Assessment: Nexplanon insertion.  Plan:  Post procedure instructions reviewed with pt.  Questions answered.  Pt knows to call with any concerns or questions.  Pt is aware removal is due by 3 calendar years from today.

## 2021-12-20 ENCOUNTER — Other Ambulatory Visit: Payer: Self-pay | Admitting: Family Medicine

## 2021-12-20 DIAGNOSIS — D649 Anemia, unspecified: Secondary | ICD-10-CM

## 2021-12-31 ENCOUNTER — Ambulatory Visit: Payer: Self-pay | Admitting: Internal Medicine

## 2021-12-31 NOTE — Progress Notes (Deleted)
Name: Marlise Eves  Age/ Sex: 36 y.o., female   MRN/ DOB: [id number], Dec 21, 1985     PCP: Mliss Sax, MD   Reason for Endocrinology Evaluation: Type 2 Diabetes Mellitus  Initial Endocrine Consultative Visit: 10/04/2018    PATIENT IDENTIFIER: Dawn Shields is a 36 y.o. female with a past medical history of T2DM, GERD, and dyslipidemia. The patient has followed with Endocrinology clinic since 10/04/2018 for consultative assistance with management of her diabetes.  DIABETIC HISTORY:  Dawn Shields was diagnosed with DM 2020, she was started on insulin briefly at diagnosis,intolerant to Rybelsus - lightheadedness. Her hemoglobin A1c has ranged from 6.2% in 2021, peaking at 13.4% in 2020.  She was followed by Dr. Everardo All from 2020 until May 2023 SUBJECTIVE:   During the last visit (07/22/2021): Saw Dr.Ellison  Today (12/31/2021): Dawn Shields is here for follow-up on diabetes management.  She checks her blood sugars *** times daily. The patient has *** had hypoglycemic episodes since the last clinic visit, which typically occur *** x / - most often occuring ***. The patient is *** symptomatic with these episodes, with symptoms of {symptoms; hypoglycemia:9084048}.      HOME DIABETES REGIMEN:  Metformin 500 mg XR 2 tabs twice daily Glimepiride 4 mg daily Jardiance 25 daily    Statin: Yes ACE-I/ARB: No no   METER DOWNLOAD SUMMARY: Date range evaluated: *** Fingerstick Blood Glucose Tests = *** Average Number Tests/Day = *** Overall Mean FS Glucose = *** Standard Deviation = ***  BG Ranges: Low = *** High = ***   Hypoglycemic Events/30 Days: BG < 50 = *** Episodes of symptomatic severe hypoglycemia = ***    DIABETIC COMPLICATIONS: Microvascular complications:  *** Denies:  Last Eye Exam: Completed   Macrovascular complications:  *** Denies: CAD, CVA, PVD   HISTORY:  Past Medical History:  Past Medical History:  Diagnosis Date   Diabetes  mellitus without complication ([facility])    Migraine    Past Surgical History: No past surgical history on file. Social History:  reports that she has never smoked. She has never used smokeless tobacco. She reports that she does not drink alcohol and does not use drugs. Family History:  Family History  Problem Relation Age of Onset   Diabetes Mother    Heart disease Mother      HOME MEDICATIONS: Allergies as of 12/31/2021       Reactions   Erythromycin Rash        Medication List        Accurate as of December 31, 2021  6:59 AM. If you have any questions, ask your nurse or doctor.          atorvastatin 10 MG tablet Commonly known as: LIPITOR TAKE 1 TABLET BY MOUTH EVERY DAY   Contour Next Test test strip Generic drug: glucose blood 1 each by Other route daily. And lancets 1/day   empagliflozin 25 MG Tabs tablet Commonly known as: Jardiance Take 1 tablet (25 mg total) by mouth daily before breakfast.   ferrous sulfate 325 (65 FE) MG tablet TAKE 1 TABLET BY MOUTH TWICE A DAY   fluconazole 150 MG tablet Commonly known as: DIFLUCAN Take 1 tablet (150 mg total) by mouth daily. Take 1 tablet as needed at first sign of infection   glimepiride 4 MG tablet Commonly known as: AMARYL Take 1 tablet (4 mg total) by mouth daily before breakfast.   metFORMIN 500 MG 24 hr tablet Commonly known as: GLUCOPHAGE-XR  Take 2 tablets (1,000 mg total) by mouth 2 (two) times daily.   Nexplanon 68 MG Impl implant Generic drug: etonogestrel 1 each by Subdermal route once. Inserted 12/15/21 Left ARM   omeprazole 40 MG capsule Commonly known as: PRILOSEC TAKE 1 CAPSULE BY MOUTH EVERY DAY   pseudoephedrine-guaifenesin 60-600 MG 12 hr tablet Commonly known as: Mucinex D Take 1 tablet by mouth every 12 (twelve) hours.   topiramate 25 MG tablet Commonly known as: TOPAMAX Take 1 tablet (25 mg total) by mouth 2 (two) times daily.         OBJECTIVE:   Vital Signs: LMP  12/10/2021   Wt Readings from Last 3 Encounters:  10/05/21 260 lb (117.9 kg)  07/22/21 272 lb 12.8 oz (123.7 kg)  06/21/21 269 lb (122 kg)     Exam: General: Pt appears well and is in NAD  Neck: General: Supple without adenopathy. Thyroid: Thyroid size normal.  No goiter or nodules appreciated.   Lungs: Clear with good BS bilat   Heart: RRR   Abdomen:  soft, nontender  Extremities: No pretibial edema.   Skin: Normal texture and temperature to palpation. No rash noted. No Acanthosis nigricans/skin tags.   Neuro: MS is good with appropriate affect, pt is alert and Ox3    DM foot exam: Please see diabetic assessment flow-sheet detailed below:           DATA REVIEWED:  Lab Results  Component Value Date   HGBA1C 7.5 (A) 07/22/2021   HGBA1C 10.4 (A) 01/18/2021   HGBA1C 11.9 (A) 11/11/2020    Latest Reference Range & Units 05/12/21 08:35  Sodium 135 - 145 mEq/L 134 (L)  Potassium 3.5 - 5.1 mEq/L 3.8  Chloride 96 - 112 mEq/L 102  CO2 19 - 32 mEq/L 26  Glucose 70 - 99 mg/dL 270 (H)  BUN 6 - 23 mg/dL 12  Creatinine 3.50 - 0.93 mg/dL 8.18  Calcium 8.4 - 29.9 mg/dL 9.6  Alkaline Phosphatase 39 - 117 U/L 56  Albumin 3.5 - 5.2 g/dL 4.3  AST 0 - 37 U/L 13  ALT 0 - 35 U/L 12  Total Protein 6.0 - 8.3 g/dL 7.8  Total Bilirubin 0.2 - 1.2 mg/dL 0.4  GFR >37.16 mL/min 91.70  Total CHOL/HDL Ratio  5  Cholesterol 0 - 200 mg/dL 967  HDL Cholesterol >89.38 mg/dL 10.17 (L)  LDL (calc) 0 - 99 mg/dL 99  Direct LDL mg/dL 510.2  NonHDL  585.27  Triglycerides 0.0 - 149.0 mg/dL 782.4 (H)  VLDL 0.0 - 23.5 mg/dL 36.1     ASSESSMENT / PLAN / RECOMMENDATIONS:   1) Type 2 Diabetes Mellitus, ***controlled, With *** complications - Most recent A1c of *** %. Goal A1c < 7.0 %.    Plan: MEDICATIONS: ***  EDUCATION / INSTRUCTIONS: BG monitoring instructions: Patient is instructed to check her blood sugars *** times a day, ***. Call [facility] if: BG persistently < 70   I reviewed the Rule of 15 for the treatment of hypoglycemia in detail with the patient. Literature supplied.    2) Diabetic complications:  Eye: Does *** have known diabetic retinopathy.  Neuro/ Feet: Does *** have known diabetic peripheral neuropathy .  Renal: Patient does not have known baseline CKD. She   is not on an ACEI/ARB at present.    3) Lipids: Patient is *** on a statin.  4) Hypertension: *** at goal of < 140/90 mmHg.    F/U in ***  Signed electronically by: Mack Guise, MD  [facility]  [facility] [address] [place], [place] [address] Phone: [phone] FAX: [phone]   CC: [address], [place] [address] Phone: [phone]  Fax: [phone]  Return to Endocrinology clinic as below: Future Appointments  Date Time Provider [place]  12/31/2021 11:50 AM Kailiana Granquist, Melanie Crazier, MD [facility] None  05/09/2022  9:20 AM Libby Maw, MD [facility]  06/27/2022  1:30 PM Tamela Gammon, NP [facility] None

## 2022-01-13 ENCOUNTER — Ambulatory Visit: Payer: Commercial Managed Care - HMO | Admitting: Internal Medicine

## 2022-01-13 NOTE — Progress Notes (Deleted)
Name: Dawn Shields  Age/ Sex: 36 y.o., female   MRN/ DOB: [id number], November 26, 1985     PCP: Mliss Sax, MD   Reason for Endocrinology Evaluation: Type 2 Diabetes Mellitus  Initial Endocrine Consultative Visit: 10/04/2018    PATIENT IDENTIFIER: Dawn Shields is a 36 y.o. female with a past medical history of T2DM, GERD, and dyslipidemia. The patient has followed with Endocrinology clinic since 10/04/2018 for consultative assistance with management of her diabetes.  DIABETIC HISTORY:  Dawn Shields was diagnosed with DM 2020, she was started on insulin briefly at diagnosis,intolerant to Rybelsus - lightheadedness. Her hemoglobin A1c has ranged from 6.2% in 2021, peaking at 13.4% in 2020.  She was followed by Dr. Everardo All from 2020 until May 2023 SUBJECTIVE:   During the last visit (07/22/2021): Saw Dr.Ellison  Today (01/13/2022): Dawn Shields is here for follow-up on diabetes management.  She checks her blood sugars *** times daily. The patient has *** had hypoglycemic episodes since the last clinic visit, which typically occur *** x / - most often occuring ***. The patient is *** symptomatic with these episodes, with symptoms of {symptoms; hypoglycemia:9084048}.    She was seen by gynecology 12/15/2021 for Nexplanon insertion  HOME DIABETES REGIMEN:  Metformin 500 mg XR 2 tabs twice daily Glimepiride 4 mg daily Jardiance 25 daily    Statin: Yes ACE-I/ARB: No no   METER DOWNLOAD SUMMARY: Date range evaluated: *** Fingerstick Blood Glucose Tests = *** Average Number Tests/Day = *** Overall Mean FS Glucose = *** Standard Deviation = ***  BG Ranges: Low = *** High = ***   Hypoglycemic Events/30 Days: BG < 50 = *** Episodes of symptomatic severe hypoglycemia = ***    DIABETIC COMPLICATIONS: Microvascular complications:  *** Denies:  Last Eye Exam: Completed   Macrovascular complications:  *** Denies: CAD, CVA, PVD   HISTORY:  Past Medical History:   Past Medical History:  Diagnosis Date   Diabetes mellitus without complication (HCC)    Migraine    Past Surgical History: No past surgical history on file. Social History:  reports that she has never smoked. She has never used smokeless tobacco. She reports that she does not drink alcohol and does not use drugs. Family History:  Family History  Problem Relation Age of Onset   Diabetes Mother    Heart disease Mother      HOME MEDICATIONS: Allergies as of 01/13/2022       Reactions   Erythromycin Rash        Medication List        Accurate as of January 13, 2022  6:59 AM. If you have any questions, ask your nurse or doctor.          atorvastatin 10 MG tablet Commonly known as: LIPITOR TAKE 1 TABLET BY MOUTH EVERY DAY   Contour Next Test test strip Generic drug: glucose blood 1 each by Other route daily. And lancets 1/day   empagliflozin 25 MG Tabs tablet Commonly known as: Jardiance Take 1 tablet (25 mg total) by mouth daily before breakfast.   ferrous sulfate 325 (65 FE) MG tablet TAKE 1 TABLET BY MOUTH TWICE A DAY   fluconazole 150 MG tablet Commonly known as: DIFLUCAN Take 1 tablet (150 mg total) by mouth daily. Take 1 tablet as needed at first sign of infection   glimepiride 4 MG tablet Commonly known as: AMARYL Take 1 tablet (4 mg total) by mouth daily before breakfast.   metFORMIN 500  MG 24 hr tablet Commonly known as: GLUCOPHAGE-XR Take 2 tablets (1,000 mg total) by mouth 2 (two) times daily.   Nexplanon 68 MG Impl implant Generic drug: etonogestrel 1 each by Subdermal route once. Inserted 12/15/21 Left ARM   omeprazole 40 MG capsule Commonly known as: PRILOSEC TAKE 1 CAPSULE BY MOUTH EVERY DAY   pseudoephedrine-guaifenesin 60-600 MG 12 hr tablet Commonly known as: Mucinex D Take 1 tablet by mouth every 12 (twelve) hours.   topiramate 25 MG tablet Commonly known as: TOPAMAX Take 1 tablet (25 mg total) by mouth 2 (two) times daily.          OBJECTIVE:   Vital Signs: LMP 12/10/2021   Wt Readings from Last 3 Encounters:  10/05/21 260 lb (117.9 kg)  07/22/21 272 lb 12.8 oz (123.7 kg)  06/21/21 269 lb (122 kg)     Exam: General: Pt appears well and is in NAD  Neck: General: Supple without adenopathy. Thyroid: Thyroid size normal.  No goiter or nodules appreciated.   Lungs: Clear with good BS bilat   Heart: RRR   Abdomen:  soft, nontender  Extremities: No pretibial edema.   Skin: Normal texture and temperature to palpation. No rash noted. No Acanthosis nigricans/skin tags.   Neuro: MS is good with appropriate affect, pt is alert and Ox3    DM foot exam: Please see diabetic assessment flow-sheet detailed below:           DATA REVIEWED:  Lab Results  Component Value Date   HGBA1C 7.5 (A) 07/22/2021   HGBA1C 10.4 (A) 01/18/2021   HGBA1C 11.9 (A) 11/11/2020    Latest Reference Range & Units 05/12/21 08:35  Sodium 135 - 145 mEq/L 134 (L)  Potassium 3.5 - 5.1 mEq/L 3.8  Chloride 96 - 112 mEq/L 102  CO2 19 - 32 mEq/L 26  Glucose 70 - 99 mg/dL 209 (H)  BUN 6 - 23 mg/dL 12  Creatinine 0.40 - 1.20 mg/dL 0.82  Calcium 8.4 - 10.5 mg/dL 9.6  Alkaline Phosphatase 39 - 117 U/L 56  Albumin 3.5 - 5.2 g/dL 4.3  AST 0 - 37 U/L 13  ALT 0 - 35 U/L 12  Total Protein 6.0 - 8.3 g/dL 7.8  Total Bilirubin 0.2 - 1.2 mg/dL 0.4  GFR >60.00 mL/min 91.70  Total CHOL/HDL Ratio  5  Cholesterol 0 - 200 mg/dL 169  HDL Cholesterol >39.00 mg/dL 37.10 (L)  LDL (calc) 0 - 99 mg/dL 99  Direct LDL mg/dL 113.0  NonHDL  132.29  Triglycerides 0.0 - 149.0 mg/dL 165.0 (H)  VLDL 0.0 - 40.0 mg/dL 33.0     ASSESSMENT / PLAN / RECOMMENDATIONS:   1) Type 2 Diabetes Mellitus, ***controlled, With *** complications - Most recent A1c of *** %. Goal A1c < 7.0 %.    Plan: MEDICATIONS: ***  EDUCATION / INSTRUCTIONS: BG monitoring instructions: Patient is instructed to check her blood sugars *** times a day, ***. Call [facility] clinic if: BG persistently < 70  I reviewed the Rule of 15 for the treatment of hypoglycemia in detail with the patient. Literature supplied.    2) Diabetic complications:  Eye: Does *** have known diabetic retinopathy.  Neuro/ Feet: Does *** have known diabetic peripheral neuropathy .  Renal: Patient does not have known baseline CKD. She   is not on an ACEI/ARB at present.    3) Lipids: Patient is *** on a statin.  4) Hypertension: *** at goal of < 140/90 mmHg.  F/U in ***    Signed electronically by: Mack Guise, MD  [facility]  [facility] [address] [place], [place] [address] Phone: [phone] FAX: [phone]   CC: Libby Maw, [place] [address] Phone: [phone]  Fax: [phone]  Return to Endocrinology clinic as below: Future Appointments  Date Time Provider [place]  01/13/2022  7:50 AM Lisa Blakeman, Melanie Crazier, MD [facility] None  05/09/2022  9:20 AM Libby Maw, MD [facility]  06/27/2022  1:30 PM Tamela Gammon, NP [facility] None

## 2022-01-24 ENCOUNTER — Telehealth: Payer: Self-pay | Admitting: *Deleted

## 2022-01-24 NOTE — Telephone Encounter (Signed)
Patient informed. 

## 2022-01-24 NOTE — Telephone Encounter (Signed)
Please reassure her that irregular bleeding is normal with Nexplanon, especially first 3-6 months. If bleeding does not slow down or stop within the next week please have her reach back out.

## 2022-01-24 NOTE — Telephone Encounter (Signed)
Patient called had Nexplanon inserted on 12/15/21 reports her cycle started on 01/16/22 and still bleeding today. Patient reports her normal cycle would last about 4-6 days, wearing pads changing about every 2-3 hour steady flow. Asked if this normal? Please advise

## 2022-02-06 ENCOUNTER — Other Ambulatory Visit: Payer: Self-pay | Admitting: Family Medicine

## 2022-02-06 DIAGNOSIS — K219 Gastro-esophageal reflux disease without esophagitis: Secondary | ICD-10-CM

## 2022-02-25 ENCOUNTER — Telehealth: Payer: Self-pay | Admitting: Internal Medicine

## 2022-02-25 MED ORDER — GLIMEPIRIDE 4 MG PO TABS: 4.0000 mg | ORAL_TABLET | Freq: Every day | ORAL | 0 refills | Status: DC

## 2022-02-25 MED ORDER — EMPAGLIFLOZIN 25 MG PO TABS: 25.0000 mg | ORAL_TABLET | Freq: Every day | ORAL | 0 refills | Status: DC

## 2022-02-25 NOTE — Telephone Encounter (Signed)
DONE

## 2022-02-25 NOTE — Telephone Encounter (Signed)
MEDICATION: Jardiance & Glimepiride  PHARMACY:  [facility] [address]  HAS THE PATIENT CONTACTED THEIR PHARMACY?  YES  IS THIS A 90 DAY SUPPLY : YES  IS PATIENT OUT OF MEDICATION: YES  IF NOT; HOW MUCH IS LEFT:   LAST APPOINTMENT DATE: @10 /26/2023  NEXT APPOINTMENT DATE:@2 /28/2024  DO WE HAVE YOUR PERMISSION TO LEAVE A DETAILED MESSAGE?: YES [phone]  OTHER COMMENTS:    **Let patient know to contact pharmacy at the end of the day to make sure medication is ready. **  ** Please notify patient to allow 48-72 hours to process**  **Encourage patient to contact the pharmacy for refills or they can request refills through [facility]**

## 2022-02-28 ENCOUNTER — Telehealth: Payer: Self-pay

## 2022-02-28 LAB — HM DIABETES EYE EXAM

## 2022-02-28 MED ORDER — DAPAGLIFLOZIN PROPANEDIOL 10 MG PO TABS: 10.0000 mg | ORAL_TABLET | Freq: Every day | ORAL | 3 refills | Status: DC

## 2022-02-28 NOTE — Telephone Encounter (Signed)
Fax from [facility] states that Dawn Shields is not covered and alternatives are  Metformin HCL 500MG , Farxiga 5mg , Metformin HCL 850mg , Metformin HCL ER 500mg , and Metformin HCL 1000MG .

## 2022-03-16 ENCOUNTER — Encounter: Payer: Self-pay | Admitting: Family Medicine

## 2022-03-21 ENCOUNTER — Encounter: Payer: Self-pay | Admitting: Nurse Practitioner

## 2022-03-23 ENCOUNTER — Other Ambulatory Visit: Payer: Self-pay | Admitting: Nurse Practitioner

## 2022-03-23 DIAGNOSIS — N939 Abnormal uterine and vaginal bleeding, unspecified: Secondary | ICD-10-CM

## 2022-03-23 MED ORDER — MEGESTROL ACETATE 40 MG PO TABS: 40.0000 mg | ORAL_TABLET | Freq: Every day | ORAL | 0 refills | Status: DC

## 2022-04-06 ENCOUNTER — Other Ambulatory Visit: Payer: Self-pay | Admitting: Internal Medicine

## 2022-04-11 ENCOUNTER — Encounter: Payer: Self-pay | Admitting: Internal Medicine

## 2022-04-11 ENCOUNTER — Encounter: Payer: Self-pay | Admitting: Nurse Practitioner

## 2022-04-12 ENCOUNTER — Other Ambulatory Visit: Payer: Self-pay | Admitting: Internal Medicine

## 2022-04-12 MED ORDER — DAPAGLIFLOZIN PROPANEDIOL 10 MG PO TABS: 10.0000 mg | ORAL_TABLET | Freq: Every day | ORAL | 1 refills | Status: DC

## 2022-05-09 ENCOUNTER — Encounter: Payer: Commercial Managed Care - HMO | Admitting: Family Medicine

## 2022-05-09 ENCOUNTER — Encounter: Payer: PRIVATE HEALTH INSURANCE | Admitting: Family Medicine

## 2022-05-09 ENCOUNTER — Encounter: Payer: Self-pay | Admitting: Family Medicine

## 2022-05-09 VITALS — BP 118/78 | HR 98 | Temp 98.0°F | Ht 71.0 in | Wt 259.0 lb

## 2022-05-09 NOTE — Progress Notes (Signed)
This encounter was created in error - please disregard.

## 2022-05-10 ENCOUNTER — Other Ambulatory Visit: Payer: Self-pay | Admitting: Family Medicine

## 2022-05-10 DIAGNOSIS — E7800 Pure hypercholesterolemia, unspecified: Secondary | ICD-10-CM

## 2022-05-14 ENCOUNTER — Encounter: Payer: Self-pay | Admitting: Nurse Practitioner

## 2022-05-14 ENCOUNTER — Other Ambulatory Visit: Payer: Self-pay | Admitting: Family Medicine

## 2022-05-14 DIAGNOSIS — G43009 Migraine without aura, not intractable, without status migrainosus: Secondary | ICD-10-CM

## 2022-05-16 ENCOUNTER — Other Ambulatory Visit: Payer: Self-pay | Admitting: Nurse Practitioner

## 2022-05-16 DIAGNOSIS — B3731 Acute candidiasis of vulva and vagina: Secondary | ICD-10-CM

## 2022-05-16 MED ORDER — FLUCONAZOLE 150 MG PO TABS: 150.0000 mg | ORAL_TABLET | Freq: Every day | ORAL | 0 refills | Status: DC

## 2022-05-16 NOTE — Telephone Encounter (Signed)
Per office notes from 11/03/2021: Pt provided refills due to recurring yeast infections due to diabetes.   AEX scheduled for 06/27/2022.

## 2022-05-18 ENCOUNTER — Ambulatory Visit (INDEPENDENT_AMBULATORY_CARE_PROVIDER_SITE_OTHER): Payer: Commercial Managed Care - HMO | Admitting: Internal Medicine

## 2022-05-18 ENCOUNTER — Encounter: Payer: Self-pay | Admitting: Internal Medicine

## 2022-05-18 VITALS — BP 122/72 | HR 60 | Ht 71.0 in | Wt 262.0 lb

## 2022-05-18 DIAGNOSIS — E1165 Type 2 diabetes mellitus with hyperglycemia: Secondary | ICD-10-CM

## 2022-05-18 LAB — POCT GLUCOSE (DEVICE FOR HOME USE): POC Glucose: 280 mg/dl — AB (ref 70–99)

## 2022-05-18 LAB — POCT GLYCOSYLATED HEMOGLOBIN (HGB A1C): Hemoglobin A1C: 9.5 % — AB (ref 4.0–5.6)

## 2022-05-18 MED ORDER — METFORMIN HCL ER 500 MG PO TB24: 1000.0000 mg | ORAL_TABLET | Freq: Two times a day (BID) | ORAL | 3 refills | Status: DC

## 2022-05-18 MED ORDER — DAPAGLIFLOZIN PROPANEDIOL 10 MG PO TABS: 10.0000 mg | ORAL_TABLET | Freq: Every day | ORAL | 3 refills | Status: DC

## 2022-05-18 MED ORDER — GLIPIZIDE 5 MG PO TABS: 5.0000 mg | ORAL_TABLET | Freq: Two times a day (BID) | ORAL | 3 refills | Status: DC

## 2022-05-18 NOTE — Patient Instructions (Signed)
Stop Glimepiride  Start Glipizide 5 mg, 1 tablet before Breakfast and Supper  Continue Metformin 500 mg XR, 2 tablets twice daily  Continue Farxiga 10 mg, 1 tablet daily      HOW TO TREAT LOW BLOOD SUGARS (Blood sugar LESS THAN 70 MG/DL) Please follow the RULE OF 15 for the treatment of hypoglycemia treatment (when your (blood sugars are less than 70 mg/dL)   STEP 1: Take 15 grams of carbohydrates when your blood sugar is low, which includes:  3-4 GLUCOSE TABS  OR 3-4 OZ OF JUICE OR REGULAR SODA OR ONE TUBE OF GLUCOSE GEL    STEP 2: RECHECK blood sugar in 15 MINUTES STEP 3: If your blood sugar is still low at the 15 minute recheck --> then, go back to STEP 1 and treat AGAIN with another 15 grams of carbohydrates.

## 2022-05-18 NOTE — Progress Notes (Signed)
Name: Dawn Shields  Age/ Sex: 37 y.o., female   MRN/ DOB: [id number], 06-07-1985     PCP: Dawn Maw, MD   Reason for Endocrinology Evaluation: Type 2 Diabetes Mellitus  Initial Endocrine Consultative Visit: 10/04/2018    PATIENT IDENTIFIER: Dawn Shields is a 37 y.o. female with a past medical history of DM, migraine headaches. The patient has followed with Endocrinology clinic since 10/04/2018 for consultative assistance with management of her diabetes.  DIABETIC HISTORY:  Dawn Shields was diagnosed with DM 2020, she was on insulin for a brief time in 2020.  She did not tolerate Rybelsus-lightheadedness.  Her hemoglobin A1c has ranged from 6.2% in 2021, peaking at 11.9% in 2022.   She was followed by Dawn Shields from 2020 until 07/2021  SUBJECTIVE:   During the last visit (07/22/2021): Saw Dawn Shields  Today (05/18/2022): Dawn Shields  She checks her blood sugars every other day times daily. The patient has not had hypoglycemic episodes since the last clinic visit.  Denies nausea, vomiting or diarrhea  Does not snack   HOME DIABETES REGIMEN:  Metformin 500 mg XR, 2 tabs twice daily Glimepiride 4 mg daily Farxiga 10 mg daily    Statin: Yes ACE-I/ARB: No   METER DOWNLOAD SUMMARY: n/a    DIABETIC COMPLICATIONS: Microvascular complications:  Denies: CKD, retinopathy , neuropathy  Last Eye Exam: Completed 03/2022  Macrovascular complications:   Denies: CAD, CVA, PVD   HISTORY:  Past Medical History:  Past Medical History:  Diagnosis Date   Diabetes mellitus without complication ([place])    Migraine    Past Surgical History: No past surgical history on file. Social History:  reports that she has never smoked. She has never used smokeless tobacco. She reports that she does not drink alcohol and does not use drugs. Family History:  Family History  Problem Relation Age of Onset   Diabetes Mother    Heart disease Mother      HOME  MEDICATIONS: Allergies as of 05/18/2022       Reactions   Erythromycin Rash        Medication List        Accurate as of May 18, 2022 10:38 AM. If you have any questions, ask your nurse or doctor.          STOP taking these medications    glimepiride 4 MG tablet Commonly known as: AMARYL Stopped by: Dawn Sciara, MD   megestrol 40 MG tablet Commonly known as: MEGACE Stopped by: Dawn Sciara, MD       TAKE these medications    atorvastatin 10 MG tablet Commonly known as: LIPITOR TAKE 1 TABLET BY MOUTH EVERY DAY   Contour Next Test test strip Generic drug: glucose blood 1 each by Other route daily. And lancets 1/day   dapagliflozin propanediol 10 MG Tabs tablet Commonly known as: Farxiga Take 1 tablet (10 mg total) by mouth daily before breakfast.   ferrous sulfate 325 (65 FE) MG tablet TAKE 1 TABLET BY MOUTH TWICE A DAY   fluconazole 150 MG tablet Commonly known as: DIFLUCAN Take 1 tablet (150 mg total) by mouth daily. Take 1 tablet as needed at first sign of infection   glipiZIDE 5 MG tablet Commonly known as: GLUCOTROL Take 1 tablet (5 mg total) by mouth 2 (two) times daily before a meal. Started by: Dawn Sciara, MD   metFORMIN 500 MG 24 hr tablet Commonly known as: GLUCOPHAGE-XR Take 2 tablets (1,000  mg total) by mouth 2 (two) times daily.   Nexplanon 68 MG Impl implant Generic drug: etonogestrel 1 each by Subdermal route once. Inserted 12/15/21 Left ARM   omeprazole 40 MG capsule Commonly known as: PRILOSEC TAKE 1 CAPSULE BY MOUTH EVERY DAY   pseudoephedrine-guaifenesin 60-600 MG 12 hr tablet Commonly known as: Mucinex D Take 1 tablet by mouth every 12 (twelve) hours.   spironolactone 50 MG tablet Commonly known as: ALDACTONE Take 50 mg by mouth daily.   topiramate 25 MG tablet Commonly known as: TOPAMAX TAKE 1 TABLET BY MOUTH TWICE A DAY   tretinoin 0.025 % cream Commonly known as:  RETIN-A SMARTSIG:Sparingly Topical Every Night PRN         OBJECTIVE:   Vital Signs: BP 122/72 (BP Location: Left Arm, Patient Position: Sitting, Cuff Size: Large)   Pulse 60   Ht '5\' 11"'$  (1.803 m)   Wt 262 lb (118.8 kg)   SpO2 98%   BMI 36.54 kg/m   Wt Readings from Last 3 Encounters:  05/18/22 262 lb (118.8 kg)  05/09/22 259 lb (117.5 kg)  10/05/21 260 lb (117.9 kg)     Exam: General: Pt appears well and is in NAD  Neck: General: Supple without adenopathy. Thyroid: Thyroid size normal.  No goiter or nodules appreciated.   Lungs: Clear with good BS bilat   Heart: RRR   Abdomen:  soft, nontender  Extremities: No pretibial edema.   Neuro: MS is good with appropriate affect, pt is alert and Ox3    DM foot exam: 05/18/2022  The skin of the feet is intact without sores or ulcerations. The pedal pulses are 2+ on right and 2+ on left. The sensation is intact to a screening 5.07, 10 gram monofilament bilaterally     DATA REVIEWED:  Lab Results  Component Value Date   HGBA1C 9.5 (A) 05/18/2022   HGBA1C 7.5 (A) 07/22/2021   HGBA1C 10.4 (A) 01/18/2021   Lab Results  Component Value Date   LDLCALC 99 05/12/2021   CREATININE 0.82 05/12/2021   No results found for: "MICRALBCREAT"   Lab Results  Component Value Date   CHOL 169 05/12/2021   HDL 37.10 (L) 05/12/2021   LDLCALC 99 05/12/2021   LDLDIRECT 113.0 05/12/2021   TRIG 165.0 (H) 05/12/2021   CHOLHDL 5 05/12/2021        In office BG 280 mg/dL   Old records , labs and images have been reviewed.   ASSESSMENT / PLAN / RECOMMENDATIONS:   1) Type 2 Diabetes Mellitus, Poorly controlled, Without complications - Most recent A1c of 9.5 %. Goal A1c < 7.0 %.     -I have discussed with the patient the pathophysiology of diabetes. We went over the natural progression of the disease. We stressed the importance of lifestyle changes. I explained the complications associated with diabetes including retinopathy,  nephropathy, neuropathy as well as increased risk of cardiovascular disease.  -I explained to the patient that diabetic patients are at higher than normal risk for amputations.  -Patient admits to drinking sweet tea, I have encouraged the patient to avoid all sugar sweetened beverages -I will change glimepiride to glipizide as below -No changes to metformin or Farxiga at this time -She was encouraged to take glipizide 15-20 minutes before the first and last meal of the day -We also emphasized the importance of glucose checks at home, and I have encouraged her to check glucose twice daily for the next 2 weeks    MEDICATIONS:  Stop glimepiride Start glipizide 5 mg, 1 tablet twice daily Continue metformin 500 mg XR, 2 tabs twice daily Continue Farxiga 10 mg, 1 tablet daily  EDUCATION / INSTRUCTIONS: BG monitoring instructions: Patient is instructed to check her blood sugars 2 times a day. Call [facility] clinic if: BG persistently < 70  I reviewed the Rule of 15 for the treatment of hypoglycemia in detail with the patient. Literature supplied.    2) Diabetic complications:  Eye: Does not have known diabetic retinopathy.  Neuro/ Feet: Does not have known diabetic peripheral neuropathy .  Renal: Patient does not have known baseline CKD. She   is not on an ACEI/ARB at present.    F/U in 4 months     Signed electronically by: Mack Guise, MD  [facility]  [facility] [address] [place], [place] [address] Phone: [phone] FAX: [phone]   CC: Dawn Maw, MD [place] [address] Phone: [phone]  Fax: [phone]  Return to Endocrinology clinic as below: Future Appointments  Date Time Provider [place]  05/23/2022  8:20 AM Dawn Maw, MD [facility]  06/27/2022  1:30 PM Tamela Gammon, NP [facility] None  11/24/2022 10:10 AM Zakhi Dupre, Melanie Crazier,  MD [facility] None

## 2022-05-23 ENCOUNTER — Encounter: Payer: Commercial Managed Care - HMO | Admitting: Family Medicine

## 2022-05-23 ENCOUNTER — Telehealth: Payer: Self-pay | Admitting: Family Medicine

## 2022-05-23 NOTE — Telephone Encounter (Signed)
No show today 3/4 said she overslept rescheduled for the 11th

## 2022-05-30 ENCOUNTER — Encounter: Payer: Commercial Managed Care - HMO | Admitting: Family Medicine

## 2022-05-30 ENCOUNTER — Telehealth: Payer: Self-pay | Admitting: Family Medicine

## 2022-05-30 NOTE — Telephone Encounter (Signed)
pt called stating she has to pick up her nephew at the same time today 1:00p - I notified pt of prior same day cancel 05/23/22 and of $50 fee for late cancellation today - pt agreed to fee and rescheduled for 3/19   Final warning sent via mychart and fee generated

## 2022-06-07 ENCOUNTER — Encounter: Payer: Commercial Managed Care - HMO | Admitting: Family Medicine

## 2022-06-07 ENCOUNTER — Telehealth: Payer: Self-pay | Admitting: Family Medicine

## 2022-06-07 NOTE — Telephone Encounter (Signed)
Pt called this morning to say her aunt got rushed to the ed and is now being sent to hospice.

## 2022-06-08 ENCOUNTER — Other Ambulatory Visit: Payer: Self-pay | Admitting: Nurse Practitioner

## 2022-06-08 ENCOUNTER — Encounter: Payer: Self-pay | Admitting: Nurse Practitioner

## 2022-06-08 DIAGNOSIS — N939 Abnormal uterine and vaginal bleeding, unspecified: Secondary | ICD-10-CM

## 2022-06-08 MED ORDER — MEGESTROL ACETATE 40 MG PO TABS: 40.0000 mg | ORAL_TABLET | Freq: Every day | ORAL | 0 refills | Status: DC

## 2022-06-09 NOTE — Telephone Encounter (Signed)
05/09/22 came for appt at 9:20 and left at 10:30 without being seen, stated she had a client and had to leave 05/23/22 same day cancel, no reason noted 05/30/22 same day cancel, had to pick up her nephew 3/19 same day cancel, stated her aunt is being taken to ER and being admitted to hospice  Pt aware of policy and letter was last sent 05/30/22 via mail and [place].  Please advise if you would like to give pt 1 more chance or proceed with dismissal.

## 2022-06-10 ENCOUNTER — Encounter: Payer: Self-pay | Admitting: Family Medicine

## 2022-06-10 NOTE — Telephone Encounter (Signed)
Dismissal generated 

## 2022-06-27 ENCOUNTER — Ambulatory Visit: Payer: 59 | Admitting: Nurse Practitioner

## 2022-06-27 NOTE — Progress Notes (Deleted)
   Dawn Shields [birth year] [id number]   History:  37 y.o. G0 presents for annual exam. Has had prolonged bleeding since Nexplanon insertion 11/2021. Contraception required due to accutane use. Wants to discuss other options. DM, HLD, migraines without aura managed by PCP. She has been having frequent yeast infections since starting Jardiance.   Gynecologic History No LMP recorded.   Contraception/Family planning: abstinence and Nexplanon Sexually active: Never  Health Maintenance Last Pap: 05/15/2019. Results were: Normal neg HPV, 5-year repeat Last mammogram: Not indicated Last colonoscopy: Not indicated Last Dexa: Not indicated  Past medical history, past surgical history, family history and social history were all reviewed and documented in the EPIC chart. 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   ROS:  A ROS was performed and pertinent positives and negatives are included.  Exam:  There were no vitals filed for this visit.  There is no height or weight on file to calculate BMI.  General appearance:  Normal Thyroid:  Symmetrical, normal in size, without palpable masses or nodularity. Respiratory  Auscultation:  Clear without wheezing or rhonchi Cardiovascular  Auscultation:  Regular rate, without rubs, murmurs or gallops  Edema/varicosities:  Not grossly evident Abdominal  Soft,nontender, without masses, guarding or rebound.  Liver/spleen:  No organomegaly noted  Hernia:  None appreciated  Skin  Inspection:  Grossly normal Breasts: Examined lying and sitting.   Right: Without masses, retractions, nipple discharge or axillary adenopathy.Scattered areas of lumpiness, likely dense tissue   Left: Without masses, retractions, nipple discharge or axillary adenopathy. Scattered areas of lumpiness, likely dense tissue Genitourinary   Inguinal/mons:  Normal without inguinal adenopathy  External genitalia:  Normal appearing vulva with no masses, tenderness, or lesions  BUS/Urethra/Skene's  glands:  Normal  Vagina:  Normal appearing with normal color and discharge, no lesions  Cervix:  Normal appearing without discharge or lesions  Uterus:  Normal in size, shape and contour.  Midline and mobile, nontender  Adnexa/parametria:     Rt: Normal in size, without masses or tenderness.   Lt: Normal in size, without masses or tenderness.  Anus and perineum: Normal  Digital rectal exam: Not indicated  Patient informed chaperone available to be present for breast and pelvic exam. Patient has requested no chaperone to be present. Patient has been advised what will be completed during breast and pelvic exam.   Assessment/Plan:  37 y.o. G0 for annual exam.   Well female exam with routine gynecological exam - Education provided on SBEs, importance of preventative screenings, current guidelines, high calcium diet, regular exercise, and multivitamin daily. Labs with PCP.   Vaginal candidiasis - Plan: fluconazole (DIFLUCAN) 150 MG tablet as needed at first sign of infection. #15 provided. She has been getting recurrent yeast infections d/t Jardiance use.   Screening for cervical cancer - Normal Pap history.  Will repeat at 5-year interval per guidelines.  Return in 1 year for annual.     [name], 10:20 AM 06/27/2022

## 2022-07-04 ENCOUNTER — Encounter: Payer: Self-pay | Admitting: *Deleted

## 2022-07-10 ENCOUNTER — Encounter: Payer: Self-pay | Admitting: Nurse Practitioner

## 2022-07-12 ENCOUNTER — Other Ambulatory Visit: Payer: Self-pay | Admitting: Nurse Practitioner

## 2022-07-12 DIAGNOSIS — N939 Abnormal uterine and vaginal bleeding, unspecified: Secondary | ICD-10-CM

## 2022-07-12 MED ORDER — MEGESTROL ACETATE 40 MG PO TABS
40.0000 mg | ORAL_TABLET | Freq: Two times a day (BID) | ORAL | 0 refills | Status: AC
Start: 2022-07-12 — End: 2022-07-22

## 2022-07-12 NOTE — Telephone Encounter (Signed)
Spoke with patient. Patient reports menses started end of March and has not stopped. Nexplanon for contraception. Changing saturated pad q2 hours. Reports fatigue, denies any other signs/symptoms of anemia. Currently take iron supplement BID. Has AEX scheduled for 08/04/22. Requesting Rx for Megace to pharmacy on file until AEX. Advised I will forward to Tiffany to review and our office will f/u with recommendations, patient agreeable.   Tiffany, please review and advise.

## 2022-07-13 ENCOUNTER — Other Ambulatory Visit: Payer: Self-pay | Admitting: Family Medicine

## 2022-07-13 DIAGNOSIS — D649 Anemia, unspecified: Secondary | ICD-10-CM

## 2022-07-14 NOTE — Telephone Encounter (Signed)
Please have patient discuss with appointments to see if anything sooner is available. If not, it is OK for her to wait until her scheduled visit.

## 2022-07-16 ENCOUNTER — Encounter: Payer: Self-pay | Admitting: Nurse Practitioner

## 2022-07-16 DIAGNOSIS — Z3046 Encounter for surveillance of implantable subdermal contraceptive: Secondary | ICD-10-CM

## 2022-07-18 NOTE — Telephone Encounter (Signed)
Per appt desk: "Appointment for Removal of Implanon is 07/26/2022 @9 :30 with Tiffany"   Will route to provider for final review and close encounter.

## 2022-07-18 NOTE — Telephone Encounter (Signed)
Yes please

## 2022-07-18 NOTE — Telephone Encounter (Signed)
Per appt desk: "Please place order/ Left message for patient to call and schedule appointment."   Order placed.

## 2022-07-26 ENCOUNTER — Ambulatory Visit (INDEPENDENT_AMBULATORY_CARE_PROVIDER_SITE_OTHER): Payer: Commercial Managed Care - HMO | Admitting: Nurse Practitioner

## 2022-07-26 VITALS — BP 116/82

## 2022-07-26 DIAGNOSIS — Z30013 Encounter for initial prescription of injectable contraceptive: Secondary | ICD-10-CM | POA: Diagnosis not present

## 2022-07-26 DIAGNOSIS — B3731 Acute candidiasis of vulva and vagina: Secondary | ICD-10-CM | POA: Diagnosis not present

## 2022-07-26 DIAGNOSIS — Z3046 Encounter for surveillance of implantable subdermal contraceptive: Secondary | ICD-10-CM | POA: Diagnosis not present

## 2022-07-26 MED ORDER — MEDROXYPROGESTERONE ACETATE 150 MG/ML IM SUSY
150.0000 mg | PREFILLED_SYRINGE | Freq: Once | INTRAMUSCULAR | Status: AC
Start: 2022-07-26 — End: 2022-07-26
  Administered 2022-07-26: 150 mg via INTRAMUSCULAR

## 2022-07-26 MED ORDER — FLUCONAZOLE 150 MG PO TABS
150.0000 mg | ORAL_TABLET | Freq: Every day | ORAL | 0 refills | Status: DC
Start: 2022-07-26 — End: 2022-10-14

## 2022-07-26 NOTE — Progress Notes (Signed)
37 y.o. G0P0000 presents for Nexplanon removal.  She has been experiencing abnormal bleeding.  She has decided to use Depo for future contraception.  Not sexually active, started for Accutane. Procedure, risks and benefits have all been explained.  She has the following questions today:  None. H/O recurrent yeast infections, requesting refill on Diflucan today.   LMP:  UNK   After all questions were answered, consent was obtained.    Past Medical History:  Diagnosis Date   Diabetes mellitus without complication (HCC)    Migraine     No past surgical history on file.  Current Outpatient Medications on File Prior to Visit  Medication Sig Dispense Refill   atorvastatin (LIPITOR) 10 MG tablet TAKE 1 TABLET BY MOUTH EVERY DAY 90 tablet 1   dapagliflozin propanediol (FARXIGA) 10 MG TABS tablet Take 1 tablet (10 mg total) by mouth daily before breakfast. 90 tablet 3   ferrous sulfate 325 (65 FE) MG tablet TAKE 1 TABLET BY MOUTH TWICE A DAY 180 tablet 1   glipiZIDE (GLUCOTROL) 5 MG tablet Take 1 tablet (5 mg total) by mouth 2 (two) times daily before a meal. 180 tablet 3   glucose blood (CONTOUR NEXT TEST) test strip 1 each by Other route daily. And lancets 1/day 100 each 3   metFORMIN (GLUCOPHAGE-XR) 500 MG 24 hr tablet Take 2 tablets (1,000 mg total) by mouth 2 (two) times daily. 360 tablet 3   omeprazole (PRILOSEC) 40 MG capsule TAKE 1 CAPSULE BY MOUTH EVERY DAY 90 capsule 1   spironolactone (ALDACTONE) 50 MG tablet Take 50 mg by mouth daily.     topiramate (TOPAMAX) 25 MG tablet TAKE 1 TABLET BY MOUTH TWICE A DAY 180 tablet 0   tretinoin (RETIN-A) 0.025 % cream SMARTSIG:Sparingly Topical Every Night PRN     pseudoephedrine-guaifenesin (MUCINEX D) 60-600 MG 12 hr tablet Take 1 tablet by mouth every 12 (twelve) hours. (Patient not taking: Reported on 05/18/2022) 10 tablet 1   No current facility-administered medications on file prior to visit.   Allergies  Allergen Reactions   Erythromycin  Rash    Vitals:   07/26/22 0929  BP: 116/82   Physical Exam  Procedure: Patient placed supine on exam table with her left arm flexed at the elbow. The prior insertion site was located and the Nexplanon rod was palpated.  Area cleansed with Betadine x 3 and draped in normal sterile fashion.  Insertion site and surrounding tissue anesthetized with 1% Lidocaine without epinephrine, 2cc total used.  Small incision made with #11 blade.  Nexplanon removed without difficulty.  Steri-strips were applied and pressure dressing placed over the site.  Entire procedure performed with sterile technique.  Pt tolerated procedure well.  Assessment/Plan:   Nexplanon removal - Plan: Removal of implanon rod  Vaginal candidiasis - Plan: fluconazole (DIFLUCAN) 150 MG tablet as needed. #10 provided.   Encounter for initial prescription of injectable contraceptive - Plan: medroxyPROGESTERone Acetate SUSY 150 mg injection. Education provided.   Post care instructions given.

## 2022-08-04 ENCOUNTER — Ambulatory Visit: Payer: Commercial Managed Care - HMO | Admitting: Nurse Practitioner

## 2022-08-13 ENCOUNTER — Other Ambulatory Visit: Payer: Self-pay | Admitting: Family Medicine

## 2022-08-13 DIAGNOSIS — K219 Gastro-esophageal reflux disease without esophagitis: Secondary | ICD-10-CM

## 2022-08-13 DIAGNOSIS — G43009 Migraine without aura, not intractable, without status migrainosus: Secondary | ICD-10-CM

## 2022-08-14 ENCOUNTER — Other Ambulatory Visit: Payer: Self-pay | Admitting: Family Medicine

## 2022-08-14 DIAGNOSIS — D649 Anemia, unspecified: Secondary | ICD-10-CM

## 2022-09-02 ENCOUNTER — Other Ambulatory Visit: Payer: Self-pay | Admitting: Family Medicine

## 2022-09-02 ENCOUNTER — Telehealth: Payer: Self-pay

## 2022-09-02 DIAGNOSIS — D649 Anemia, unspecified: Secondary | ICD-10-CM

## 2022-09-02 DIAGNOSIS — K219 Gastro-esophageal reflux disease without esophagitis: Secondary | ICD-10-CM

## 2022-09-02 DIAGNOSIS — E1165 Type 2 diabetes mellitus with hyperglycemia: Secondary | ICD-10-CM

## 2022-09-02 MED ORDER — METFORMIN HCL ER 500 MG PO TB24
1000.0000 mg | ORAL_TABLET | Freq: Two times a day (BID) | ORAL | 3 refills | Status: DC
Start: 2022-09-02 — End: 2022-11-24

## 2022-09-02 MED ORDER — OMEPRAZOLE 40 MG PO CPDR
DELAYED_RELEASE_CAPSULE | ORAL | 0 refills | Status: AC
Start: 2022-09-02 — End: ?

## 2022-09-02 MED ORDER — FERROUS SULFATE 325 (65 FE) MG PO TABS
ORAL_TABLET | ORAL | 0 refills | Status: AC
Start: 2022-09-02 — End: ?

## 2022-09-02 NOTE — Addendum Note (Signed)
Addended by: Andrez Grime on: 09/02/2022 05:11 PM   Modules accepted: Orders

## 2022-09-02 NOTE — Telephone Encounter (Signed)
Pt requesting Rf sent in to pharmacy to sustain her until she establishes care with new PCP.

## 2022-09-20 ENCOUNTER — Ambulatory Visit: Payer: Commercial Managed Care - HMO | Admitting: Nurse Practitioner

## 2022-09-20 NOTE — Progress Notes (Deleted)
   Dawn Shields 27-Jul-1985 [id number]   History:  37 y.o. G0 presents for annual exam. Nexplanon removed 07/26/22 and Depo initiated at that time. On Accutane. Was having prolonged bleeding. DM, HLD, migraines managed by PCP. She has been having frequent yeast infections since starting Jardiance.   Gynecologic History No LMP recorded.   Contraception/Family planning: abstinence and Depo-Provera injections Sexually active: No, never has been  Health Maintenance Last Pap: 05/15/2019. Results were: Normal neg HPV, 5-year repeat Last mammogram: Not indicated Last colonoscopy: Not indicated Last Dexa: Not indicated  Past medical history, past surgical history, family history and social history were all reviewed and documented in the EPIC chart. 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   ROS:  A ROS was performed and pertinent positives and negatives are included.  Exam:  There were no vitals filed for this visit.  There is no height or weight on file to calculate BMI.  General appearance:  Normal Thyroid:  Symmetrical, normal in size, without palpable masses or nodularity. Respiratory  Auscultation:  Clear without wheezing or rhonchi Cardiovascular  Auscultation:  Regular rate, without rubs, murmurs or gallops  Edema/varicosities:  Not grossly evident Abdominal  Soft,nontender, without masses, guarding or rebound.  Liver/spleen:  No organomegaly noted  Hernia:  None appreciated  Skin  Inspection:  Grossly normal Breasts: Examined lying and sitting.   Right: Without masses, retractions, nipple discharge or axillary adenopathy.Scattered areas of lumpiness, likely dense tissue   Left: Without masses, retractions, nipple discharge or axillary adenopathy. Scattered areas of lumpiness, likely dense tissue Genitourinary   Inguinal/mons:  Normal without inguinal adenopathy  External genitalia:  Normal appearing vulva with no masses, tenderness, or lesions  BUS/Urethra/Skene's glands:   Normal  Vagina:  Normal appearing with normal color and discharge, no lesions  Cervix:  Normal appearing without discharge or lesions  Uterus:  Normal in size, shape and contour.  Midline and mobile, nontender  Adnexa/parametria:     Rt: Normal in size, without masses or tenderness.   Lt: Normal in size, without masses or tenderness.  Anus and perineum: Normal  Digital rectal exam: Not indicated  Patient informed chaperone available to be present for breast and pelvic exam. Patient has requested no chaperone to be present. Patient has been advised what will be completed during breast and pelvic exam.   Assessment/Plan:  37 y.o. G0 for annual exam.   Well female exam with routine gynecological exam - Education provided on SBEs, importance of preventative screenings, current guidelines, high calcium diet, regular exercise, and multivitamin daily. Labs with PCP.   Vaginal itching - Plan: WET PREP FOR TRICH, YEAST, CLUE. Negative wet prep and exam today.   Vaginal candidiasis - Plan: fluconazole (DIFLUCAN) 150 MG tablet as needed at first sign of infection. #15 provided. She has been getting recurrent yeast infections d/t Jardiance use.   Dense breasts - patient has bilateral dense breast tissue. Area of concern in outer left breast that she noted recently feels fibrocystic in nature. She will monitor and if no resolution in 2-4 weeks we will send for imaging. She is agreeable.   Screening for cervical cancer - Normal Pap history.  Will repeat at 5-year interval per guidelines.  Return in 1 year for annual.     [name], 11:05 AM 09/20/2022

## 2022-10-07 ENCOUNTER — Other Ambulatory Visit: Payer: Self-pay | Admitting: Internal Medicine

## 2022-10-11 ENCOUNTER — Ambulatory Visit (INDEPENDENT_AMBULATORY_CARE_PROVIDER_SITE_OTHER): Payer: Commercial Managed Care - HMO | Admitting: *Deleted

## 2022-10-11 DIAGNOSIS — Z3042 Encounter for surveillance of injectable contraceptive: Secondary | ICD-10-CM

## 2022-10-11 MED ORDER — MEDROXYPROGESTERONE ACETATE 150 MG/ML IM SUSP
150.0000 mg | Freq: Once | INTRAMUSCULAR | Status: AC
Start: 2022-10-11 — End: 2022-10-11
  Administered 2022-10-11: 150 mg via INTRAMUSCULAR

## 2022-10-14 ENCOUNTER — Encounter: Payer: Self-pay | Admitting: Nurse Practitioner

## 2022-10-14 DIAGNOSIS — B3731 Acute candidiasis of vulva and vagina: Secondary | ICD-10-CM

## 2022-10-14 MED ORDER — FLUCONAZOLE 150 MG PO TABS
ORAL_TABLET | ORAL | 0 refills | Status: DC
Start: 2022-10-14 — End: 2022-12-07

## 2022-10-14 NOTE — Telephone Encounter (Signed)
Routing to provider for final review and closing.

## 2022-10-14 NOTE — Telephone Encounter (Signed)
TW pt. H/o recurrent yeast infections. Type 2 DM. Last rx for diflucan sent for #10 in 07/2022.   Last AEX 06/21/2021--scheduled for 12/07/2022. Last seen on 07/26/2022  Please advise.

## 2022-10-14 NOTE — Telephone Encounter (Signed)
Ok to send #3 to take once every 3 days

## 2022-11-24 ENCOUNTER — Ambulatory Visit (INDEPENDENT_AMBULATORY_CARE_PROVIDER_SITE_OTHER): Payer: Commercial Managed Care - HMO | Admitting: Internal Medicine

## 2022-11-24 ENCOUNTER — Encounter: Payer: Self-pay | Admitting: Internal Medicine

## 2022-11-24 VITALS — BP 124/82 | HR 95 | Ht 71.0 in | Wt 252.0 lb

## 2022-11-24 DIAGNOSIS — E1165 Type 2 diabetes mellitus with hyperglycemia: Secondary | ICD-10-CM | POA: Diagnosis not present

## 2022-11-24 DIAGNOSIS — Z7984 Long term (current) use of oral hypoglycemic drugs: Secondary | ICD-10-CM

## 2022-11-24 LAB — POCT GLYCOSYLATED HEMOGLOBIN (HGB A1C): Hemoglobin A1C: 12.2 % — AB (ref 4.0–5.6)

## 2022-11-24 LAB — POCT GLUCOSE (DEVICE FOR HOME USE): POC Glucose: 375 mg/dL — AB (ref 70–99)

## 2022-11-24 MED ORDER — FREESTYLE LIBRE 3 SENSOR MISC: 1.0000 | 3 refills | Status: AC

## 2022-11-24 MED ORDER — GLIPIZIDE 10 MG PO TABS: 10.0000 mg | ORAL_TABLET | Freq: Two times a day (BID) | ORAL | 3 refills | Status: DC

## 2022-11-24 MED ORDER — DAPAGLIFLOZIN PROPANEDIOL 10 MG PO TABS: 10.0000 mg | ORAL_TABLET | Freq: Every day | ORAL | 3 refills | Status: DC

## 2022-11-24 MED ORDER — METFORMIN HCL ER 500 MG PO TB24: 1000.0000 mg | ORAL_TABLET | Freq: Two times a day (BID) | ORAL | 3 refills | Status: DC

## 2022-11-24 MED ORDER — ONETOUCH VERIO VI STRP: 1.0000 | ORAL_STRIP | Freq: Every day | 12 refills | Status: DC

## 2022-11-24 MED ORDER — ONETOUCH VERIO W/DEVICE KIT: 1.0000 | PACK | Freq: Every day | 0 refills | Status: AC

## 2022-11-24 NOTE — Progress Notes (Signed)
Name: Dawn Shields  Age/ Sex: 37 y.o., female   MRN/ DOB: [id number], [birth year]     PCP: Pcp, No   Reason for Endocrinology Evaluation: Type 2 Diabetes Mellitus  Initial Endocrine Consultative Visit: 10/04/2018    PATIENT IDENTIFIER: Dawn Shields is a 37 y.o. female with a past medical history of DM, migraine headaches. The patient has followed with Endocrinology clinic since 10/04/2018 for consultative assistance with management of her diabetes.  DIABETIC HISTORY:  Dawn Shields was diagnosed with DM 2020, she was on insulin for a brief time in 2020.  She did not tolerate Rybelsus-lightheadedness.  Her hemoglobin A1c has ranged from 6.2% in 2021, peaking at 11.9% in 2022.    She was followed by Dr. Everardo All from 2020 until 07/2021   On her initial visit with me 04/2022 she had an A1c of 9.5%, she was on glimepiride, metformin, and Farxiga.  I switched glimepiride to glipizide  SUBJECTIVE:   During the last visit (05/18/2022): A1c 9.5%   Today (11/24/2022): Dawn Shields  She checks her blood sugars every other day times daily. The patient has not had hypoglycemic episodes since the last clinic visit.  Denies nausea, vomiting  Denies or diarrhea    HOME DIABETES REGIMEN:  Metformin 500 mg XR, 2 tabs twice daily- takes 1 tabs BID  Glipizide 5 mg twice daily Farxiga 10 mg daily    Statin: Yes ACE-I/ARB: No   METER DOWNLOAD SUMMARY: n/a    DIABETIC COMPLICATIONS: Microvascular complications:  Denies: CKD, retinopathy , neuropathy  Last Eye Exam: Completed 03/2022  Macrovascular complications:   Denies: CAD, CVA, PVD   HISTORY:  Past Medical History:  Past Medical History:  Diagnosis Date   Diabetes mellitus without complication (HCC)    Migraine    Past Surgical History: No past surgical history on file. Social History:  reports that she has never smoked. She has never used smokeless tobacco. She reports that she does not drink alcohol and does not use  drugs. Family History:  Family History  Problem Relation Age of Onset   Diabetes Mother    Heart disease Mother      HOME MEDICATIONS: Allergies as of 11/24/2022       Reactions   Erythromycin Rash        Medication List        Accurate as of November 24, 2022 12:08 PM. If you have any questions, ask your nurse or doctor.          atorvastatin 10 MG tablet Commonly known as: LIPITOR TAKE 1 TABLET BY MOUTH EVERY DAY   dapagliflozin propanediol 10 MG Tabs tablet Commonly known as: Farxiga Take 1 tablet (10 mg total) by mouth daily before breakfast.   ferrous sulfate 325 (65 FE) MG tablet TAKE 1 TABLET BY MOUTH TWICE A DAY   fluconazole 150 MG tablet Commonly known as: DIFLUCAN Take 1 tablet as one dose, then repeat every 72 hours for remaining doses.   FreeStyle Libre 3 Sensor Misc 1 Device by Does not apply route every 14 (fourteen) days. Place 1 sensor on the skin every 14 days. Use to check glucose continuously Started by: Johnney Ou Alani Sabbagh   glipiZIDE 10 MG tablet Commonly known as: GLUCOTROL Take 1 tablet (10 mg total) by mouth 2 (two) times daily before a meal. What changed:  medication strength how much to take Changed by: Johnney Ou Mariyah Upshaw   metFORMIN 500 MG 24 hr tablet Commonly known as: GLUCOPHAGE-XR Take  2 tablets (1,000 mg total) by mouth 2 (two) times daily.   omeprazole 40 MG capsule Commonly known as: PRILOSEC TAKE 1 CAPSULE BY MOUTH EVERY DAY   OneTouch Verio test strip Generic drug: glucose blood 1 each by Other route daily in the afternoon. Use as instructed What changed:  when to take this additional instructions Changed by: Johnney Ou Keanthony Poole   OneTouch Verio w/Device Kit 1 Device by Does not apply route daily in the afternoon. Started by: Johnney Ou Atleigh Gruen   pseudoephedrine-guaifenesin 60-600 MG 12 hr tablet Commonly known as: Mucinex D Take 1 tablet by mouth every 12 (twelve) hours.   spironolactone 50 MG  tablet Commonly known as: ALDACTONE Take 50 mg by mouth daily.   topiramate 25 MG tablet Commonly known as: TOPAMAX TAKE 1 TABLET BY MOUTH TWICE A DAY   tretinoin 0.025 % cream Commonly known as: RETIN-A SMARTSIG:Sparingly Topical Every Night PRN         OBJECTIVE:   Vital Signs: BP 124/82 (BP Location: Left Arm, Patient Position: Sitting, Cuff Size: Large)   Pulse 95   Ht 5\' 11"  (1.803 m)   Wt 252 lb (114.3 kg)   SpO2 99%   BMI 35.15 kg/m   Wt Readings from Last 3 Encounters:  11/24/22 252 lb (114.3 kg)  05/18/22 262 lb (118.8 kg)  05/09/22 259 lb (117.5 kg)     Exam: General: Pt appears well and is in NAD  Neck: General: Supple without adenopathy. Thyroid: Thyroid size normal.  No goiter or nodules appreciated.   Lungs: Clear with good BS bilat   Heart: RRR   Abdomen:  soft, nontender  Extremities: No pretibial edema.   Neuro: MS is good with appropriate affect, pt is alert and Ox3    DM foot exam: 05/18/2022  The skin of the feet is intact without sores or ulcerations. The pedal pulses are 2+ on right and 2+ on left. The sensation is intact to a screening 5.07, 10 gram monofilament bilaterally     DATA REVIEWED:  Lab Results  Component Value Date   HGBA1C 12.2 (A) 11/24/2022   HGBA1C 9.5 (A) 05/18/2022   HGBA1C 7.5 (A) 07/22/2021   Lab Results  Component Value Date   LDLCALC 99 05/12/2021   CREATININE 0.82 05/12/2021   No results found for: "MICRALBCREAT"   Lab Results  Component Value Date   CHOL 169 05/12/2021   HDL 37.10 (L) 05/12/2021   LDLCALC 99 05/12/2021   LDLDIRECT 113.0 05/12/2021   TRIG 165.0 (H) 05/12/2021   CHOLHDL 5 05/12/2021        In office BG 375  mg/dL - drank gatorade      ASSESSMENT / PLAN / RECOMMENDATIONS:   1) Type 2 Diabetes Mellitus, Poorly controlled, Without complications - Most recent A1c of 12.2 %. Goal A1c < 7.0 %.    -Patient has been noted worsening glycemic control, this is due to dietary  indiscretions -Patient declined insulin -She declined trying another GLP-1 agonist, she was on Rybelsus in the past and developed lightheadedness -We again emphasized the importance of avoiding sugar sweetened beverages -Discussed portion control with eating CHO including foods -Declined CDE referral -Will increase metformin as well as glipizide as below -Freestyle libre 3 sensor has been prescribed   MEDICATIONS: Increase  glipizide 10 mg, 1 tablet twice daily Increase metformin 500 mg XR, 2 tabs twice daily Continue Farxiga 10 mg, 1 tablet daily  EDUCATION / INSTRUCTIONS: BG monitoring instructions: Patient is instructed  to check her blood sugars 2 times a day. Call [facility] if: BG persistently < 70  I reviewed the Rule of 15 for the treatment of hypoglycemia in detail with the patient. Literature supplied.    2) Diabetic complications:  Eye: Does not have known diabetic retinopathy.  Neuro/ Feet: Does not have known diabetic peripheral neuropathy .  Renal: Patient does not have known baseline CKD. She   is not on an ACEI/ARB at present.    F/U in 4 months    I spent 25 minutes preparing to see the patient by review of recent labs, imaging and procedures, obtaining and reviewing separately obtained history, communicating with the patient, ordering medications, tests or procedures, and documenting clinical information in the EHR including the differential Dx, treatment, and any further evaluation and other management    Signed electronically by: Lyndle Herrlich, MD  [facility] Endocrinology  [facility] [address] [place] [place], [place] [address] Phone: [phone] FAX: [phone]   CC: Pcp, No No address on file Phone: None  Fax: None  Return to Endocrinology clinic as below: Future Appointments  Date Time Provider Department Center  12/07/2022 10:00 AM [name], NP [facility] None  04/04/2023  2:20 PM  Drako Maese, Konrad Dolores, MD [facility] None

## 2022-11-24 NOTE — Patient Instructions (Addendum)
Increase Glipizide 10 mg, 1 tablet before Breakfast and Supper  Increase  Metformin 500 mg XR, 2 tablets twice daily  Continue Farxiga 10 mg, 1 tablet daily      HOW TO TREAT LOW BLOOD SUGARS (Blood sugar LESS THAN 70 MG/DL) Please follow the RULE OF 15 for the treatment of hypoglycemia treatment (when your (blood sugars are less than 70 mg/dL)   STEP 1: Take 15 grams of carbohydrates when your blood sugar is low, which includes:  3-4 GLUCOSE TABS  OR 3-4 OZ OF JUICE OR REGULAR SODA OR ONE TUBE OF GLUCOSE GEL    STEP 2: RECHECK blood sugar in 15 MINUTES STEP 3: If your blood sugar is still low at the 15 minute recheck --> then, go back to STEP 1 and treat AGAIN with another 15 grams of carbohydrates.

## 2022-12-07 ENCOUNTER — Ambulatory Visit (INDEPENDENT_AMBULATORY_CARE_PROVIDER_SITE_OTHER): Payer: Commercial Managed Care - HMO | Admitting: Nurse Practitioner

## 2022-12-07 ENCOUNTER — Encounter: Payer: Self-pay | Admitting: Nurse Practitioner

## 2022-12-07 VITALS — BP 122/78 | HR 77 | Ht 71.0 in | Wt 255.2 lb

## 2022-12-07 DIAGNOSIS — Z01419 Encounter for gynecological examination (general) (routine) without abnormal findings: Secondary | ICD-10-CM | POA: Diagnosis not present

## 2022-12-07 DIAGNOSIS — N760 Acute vaginitis: Secondary | ICD-10-CM

## 2022-12-07 DIAGNOSIS — Z3042 Encounter for surveillance of injectable contraceptive: Secondary | ICD-10-CM

## 2022-12-07 MED ORDER — FLUCONAZOLE 150 MG PO TABS: 150.0000 mg | ORAL_TABLET | Freq: Every day | ORAL | 1 refills | Status: DC | PRN

## 2022-12-07 NOTE — Progress Notes (Signed)
Dawn Shields Apr 10, 1985 [id number]   History:  37 y.o. G0 presents for annual exam. Nexplanon removed in May. Receiving Depo. Last dose 10/11/2022. Having some irregular bleeding, not bothersome, light to moderate. Normal pap history. DM, HLD, migraines managed by PCP. She has recurrent yeast infections since starting Jardiance. Needs refill on Diflucan.  Gynecologic History Patient's last menstrual period was 11/28/2022. Period Pattern: (!) Irregular Menstrual Flow: Light, Moderate Menstrual Control: Maxi pad Menstrual Control Change Freq (Hours): 2 Dysmenorrhea: (!) Mild Dysmenorrhea Symptoms: Cramping Contraception/Family planning: abstinence and Depo-Provera injections Sexually active: No, never has been  Health Maintenance Last Pap: 05/15/2019. Results were: Normal neg HPV, 5-year repeat Last mammogram: Not indicated Last colonoscopy: Not indicated Last Dexa: Not indicated  Past medical history, past surgical history, family history and social history were all reviewed and documented in the EPIC chart. 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   ROS:  A ROS was performed and pertinent positives and negatives are included.  Exam:  Vitals:   12/07/22 0950  BP: 122/78  Pulse: 77  SpO2: 100%  Weight: 255 lb 3.2 oz (115.8 kg)  Height: 5\' 11"  (1.803 m)   Body mass index is 35.59 kg/m.  General appearance:  Normal Thyroid:  Symmetrical, normal in size, without palpable masses or nodularity. Respiratory  Auscultation:  Clear without wheezing or rhonchi Cardiovascular  Auscultation:  Regular rate, without rubs, murmurs or gallops  Edema/varicosities:  Not grossly evident Abdominal  Soft,nontender, without masses, guarding or rebound.  Liver/spleen:  No organomegaly noted  Hernia:  None appreciated  Skin  Inspection:  Grossly normal Breasts: Examined lying and sitting.   Right: Without masses, retractions, nipple discharge or axillary  adenopathy.   Left: Without masses, retractions, nipple discharge or axillary adenopathy.  Genitourinary Declines d/t menses  Patient informed chaperone available to be present for breast and pelvic exam. Patient has requested no chaperone to be present. Patient has been advised what will be completed during breast and pelvic exam.   Assessment/Plan:  37 y.o. G0 for annual exam.   Well female exam with routine gynecological exam - Education provided on SBEs, importance of preventative screenings, current guidelines, high calcium diet, regular exercise, and multivitamin daily. Labs with PCP.   Recurrent vaginitis - Plan: fluconazole (DIFLUCAN) 150 MG tablet as needed. Recurrent yeast infections since starting Jardiance. Takes at first sign of infection. One dose is always enough.   Encounter for surveillance of injectable contraceptive - Having some irregular bleeding. Not bothersome. Light to moderate. Aware normal with depo especially first 6 months.   Screening for cervical cancer - Normal Pap history.  Will repeat at 5-year interval per guidelines.  Return in 1 year for annual.     Dawn Mackie DNP, 10:12 AM 12/07/2022

## 2022-12-08 ENCOUNTER — Encounter: Payer: Self-pay | Admitting: Internal Medicine

## 2022-12-27 ENCOUNTER — Ambulatory Visit (INDEPENDENT_AMBULATORY_CARE_PROVIDER_SITE_OTHER): Payer: Managed Care, Other (non HMO)

## 2022-12-27 DIAGNOSIS — Z3042 Encounter for surveillance of injectable contraceptive: Secondary | ICD-10-CM

## 2022-12-27 MED ORDER — MEDROXYPROGESTERONE ACETATE 150 MG/ML IM SUSY
150.0000 mg | PREFILLED_SYRINGE | Freq: Once | INTRAMUSCULAR | Status: AC
Start: 2022-12-27 — End: 2022-12-27
  Administered 2022-12-27: 150 mg via INTRAMUSCULAR

## 2023-01-20 ENCOUNTER — Other Ambulatory Visit: Payer: Self-pay

## 2023-01-20 ENCOUNTER — Encounter: Payer: Self-pay | Admitting: Internal Medicine

## 2023-01-20 MED ORDER — ONETOUCH DELICA LANCETS 33G MISC: 3 refills | Status: AC

## 2023-03-13 ENCOUNTER — Ambulatory Visit: Payer: Managed Care, Other (non HMO)

## 2023-03-24 ENCOUNTER — Ambulatory Visit (INDEPENDENT_AMBULATORY_CARE_PROVIDER_SITE_OTHER): Payer: Commercial Managed Care - HMO

## 2023-03-24 DIAGNOSIS — Z3042 Encounter for surveillance of injectable contraceptive: Secondary | ICD-10-CM | POA: Diagnosis not present

## 2023-03-24 MED ORDER — MEDROXYPROGESTERONE ACETATE 150 MG/ML IM SUSY
150.0000 mg | PREFILLED_SYRINGE | Freq: Once | INTRAMUSCULAR | Status: AC
Start: 2023-03-24 — End: 2023-03-24
  Administered 2023-03-24: 150 mg via INTRAMUSCULAR

## 2023-03-24 NOTE — Progress Notes (Signed)
 Medroxyprogesterone Acetate 150mg  injection given IM RUOQ. Patient tolerated injection well.  Next injection is due 06/09/23-06/23/23

## 2023-04-04 ENCOUNTER — Ambulatory Visit: Payer: Commercial Managed Care - HMO | Admitting: Internal Medicine

## 2023-04-07 ENCOUNTER — Ambulatory Visit: Payer: Commercial Managed Care - HMO | Admitting: Internal Medicine

## 2023-04-07 ENCOUNTER — Encounter: Payer: Self-pay | Admitting: Internal Medicine

## 2023-04-07 VITALS — BP 126/80 | HR 85 | Ht 71.0 in | Wt 254.0 lb

## 2023-04-07 DIAGNOSIS — E1165 Type 2 diabetes mellitus with hyperglycemia: Secondary | ICD-10-CM

## 2023-04-07 DIAGNOSIS — Z7984 Long term (current) use of oral hypoglycemic drugs: Secondary | ICD-10-CM

## 2023-04-07 LAB — POCT GLYCOSYLATED HEMOGLOBIN (HGB A1C): Hemoglobin A1C: 7.9 % — AB (ref 4.0–5.6)

## 2023-04-07 NOTE — Progress Notes (Signed)
Name: Dawn Shields  Age/ Sex: 38 y.o., female   MRN/ DOB: [id number], Feb 17, 1986     PCP: Patient, No Pcp Per   Reason for Endocrinology Evaluation: Type 2 Diabetes Mellitus  Initial Endocrine Consultative Visit: 10/04/2018    PATIENT IDENTIFIER: Dawn Shields is a 38 y.o. female with a past medical history of DM, migraine headaches. The patient has followed with Endocrinology clinic since 10/04/2018 for consultative assistance with management of her diabetes.  DIABETIC HISTORY:  Dawn Shields was diagnosed with DM 2020, she was on insulin for a brief time in 2020.  She did not tolerate Rybelsus-lightheadedness.  Her hemoglobin A1c has ranged from 6.2% in 2021, peaking at 11.9% in 2022.    She was followed by Dr. Everardo All from 2020 until 07/2021   On her initial visit with me 04/2022 she had an A1c of 9.5%, she was on glimepiride, metformin, and Farxiga.  I switched glimepiride to glipizide  SUBJECTIVE:   During the last visit (11/24/2022): A1c 12.2%     Today (04/07/2023): Dawn Shields  She checks her blood sugars every other day times daily. The patient has not had hypoglycemic episodes since the last clinic visit.  Denies nausea, vomiting  Denies  constipation or diarrhea  Denies lightheadedness    HOME DIABETES REGIMEN:  Metformin 500 mg XR, 2 tabs twice daily Glipizide 10 mg twice daily Farxiga 10 mg daily    Statin: Yes ACE-I/ARB: No   METER DOWNLOAD SUMMARY:   104 - 190 mg/dL    DIABETIC COMPLICATIONS: Microvascular complications:  Denies: CKD, retinopathy , neuropathy  Last Eye Exam: Completed 03/2022  Macrovascular complications:   Denies: CAD, CVA, PVD   HISTORY:  Past Medical History:  Past Medical History:  Diagnosis Date   Diabetes mellitus without complication (HCC)    Migraine    Past Surgical History: No past surgical history on file. Social History:  reports that she has never smoked. She has never used smokeless tobacco. She reports  that she does not drink alcohol and does not use drugs. Family History:  Family History  Problem Relation Age of Onset   Diabetes Mother    Heart disease Mother      HOME MEDICATIONS: Allergies as of 04/07/2023       Reactions   Erythromycin Rash        Medication List        Accurate as of April 07, 2023 11:52 AM. If you have any questions, ask your nurse or doctor.          atorvastatin 10 MG tablet Commonly known as: LIPITOR TAKE 1 TABLET BY MOUTH EVERY DAY   dapagliflozin propanediol 10 MG Tabs tablet Commonly known as: Farxiga Take 1 tablet (10 mg total) by mouth daily before breakfast.   ferrous sulfate 325 (65 FE) MG tablet TAKE 1 TABLET BY MOUTH TWICE A DAY   fluconazole 150 MG tablet Commonly known as: DIFLUCAN Take 1 tablet (150 mg total) by mouth daily as needed.   FreeStyle Libre 3 Sensor Misc 1 Device by Does not apply route every 14 (fourteen) days. Place 1 sensor on the skin every 14 days. Use to check glucose continuously   glipiZIDE 10 MG tablet Commonly known as: GLUCOTROL Take 1 tablet (10 mg total) by mouth 2 (two) times daily before a meal.   metFORMIN 500 MG 24 hr tablet Commonly known as: GLUCOPHAGE-XR Take 2 tablets (1,000 mg total) by mouth 2 (two) times daily.  omeprazole 40 MG capsule Commonly known as: PRILOSEC TAKE 1 CAPSULE BY MOUTH EVERY DAY   OneTouch Delica Lancets 33G Misc Use to check blood sugar daily   OneTouch Verio test strip Generic drug: glucose blood 1 each by Other route daily in the afternoon. Use as instructed   OneTouch Verio w/Device Kit 1 Device by Does not apply route daily in the afternoon.   pseudoephedrine-guaifenesin 60-600 MG 12 hr tablet Commonly known as: Mucinex D Take 1 tablet by mouth every 12 (twelve) hours.   spironolactone 50 MG tablet Commonly known as: ALDACTONE Take 50 mg by mouth daily.   topiramate 25 MG tablet Commonly known as: TOPAMAX TAKE 1 TABLET BY MOUTH TWICE A  DAY   tretinoin 0.025 % cream Commonly known as: RETIN-A SMARTSIG:Sparingly Topical Every Night PRN         OBJECTIVE:   Vital Signs: BP 126/80 (BP Location: Left Arm, Patient Position: Sitting, Cuff Size: Normal)   Pulse 85   Ht 5\' 11"  (1.803 m)   Wt 254 lb (115.2 kg)   SpO2 99%   BMI 35.43 kg/m   Wt Readings from Last 3 Encounters:  04/07/23 254 lb (115.2 kg)  12/07/22 255 lb 3.2 oz (115.8 kg)  11/24/22 252 lb (114.3 kg)     Exam: General: Pt appears well and is in NAD  Lungs: Clear with good BS bilat   Heart: RRR   Extremities: No pretibial edema.   Neuro: MS is good with appropriate affect, pt is alert and Ox3    DM foot exam: 04/07/2023  The skin of the feet is intact without sores or ulcerations. The pedal pulses are 2+ on right and 2+ on left. The sensation is intact to a screening 5.07, 10 gram monofilament bilaterally     DATA REVIEWED:  Lab Results  Component Value Date   HGBA1C 7.9 (A) 04/07/2023   HGBA1C 12.2 (A) 11/24/2022   HGBA1C 9.5 (A) 05/18/2022   Lab Results  Component Value Date   LDLCALC 99 05/12/2021   CREATININE 0.82 05/12/2021   No results found for: "MICRALBCREAT"   Lab Results  Component Value Date   CHOL 169 05/12/2021   HDL 37.10 (L) 05/12/2021   LDLCALC 99 05/12/2021   LDLDIRECT 113.0 05/12/2021   TRIG 165.0 (H) 05/12/2021   CHOLHDL 5 05/12/2021           ASSESSMENT / PLAN / RECOMMENDATIONS:   1) Type 2 Diabetes Mellitus, Sub-Optimally controlled, Without complications - Most recent A1c of 7.9%. Goal A1c < 7.0 %.    - I have praised the pt on improving glycemic control  -Patient declined insulin -She declined trying another GLP-1 agonist, she was on Rybelsus in the past and developed lightheadedness - No changes  - She will have labs through PCP soon    MEDICATIONS: Continue sure glipizide 10 mg, 1 tablet twice daily Continue  metformin 500 mg XR, 2 tabs twice daily Continue Farxiga 10 mg, 1 tablet  daily  EDUCATION / INSTRUCTIONS: BG monitoring instructions: Patient is instructed to check her blood sugars 2 times a day. Call [place] [facility] if: BG persistently < 70  I reviewed the Rule of 15 for the treatment of hypoglycemia in detail with the patient. Literature supplied.    2) Diabetic complications:  Eye: Does not have known diabetic retinopathy.  Neuro/ Feet: Does not have known diabetic peripheral neuropathy .  Renal: Patient does not have known baseline CKD. She   is not  on an ACEI/ARB at present.    F/U in 4 months      Signed electronically by: Lyndle Herrlich, MD  [facility] Endocrinology  [facility] [address] [place], [place] [address] Phone: [phone] FAX: [phone]   CC: Patient, No Pcp Per No address on file Phone: None  Fax: None  Return to Endocrinology clinic as below: Future Appointments  Date Time Provider Department Center  06/09/2023 10:15 AM [facility] NURSE [facility] None  12/11/2023 10:00 AM [name], NP [facility] None

## 2023-04-07 NOTE — Patient Instructions (Signed)
Continue Glipizide 10 mg, 1 tablet before Breakfast and Supper  Continue  Metformin 500 mg XR, 2 tablets twice daily  Continue Farxiga 10 mg, 1 tablet daily      HOW TO TREAT LOW BLOOD SUGARS (Blood sugar LESS THAN 70 MG/DL) Please follow the RULE OF 15 for the treatment of hypoglycemia treatment (when your (blood sugars are less than 70 mg/dL)   STEP 1: Take 15 grams of carbohydrates when your blood sugar is low, which includes:  3-4 GLUCOSE TABS  OR 3-4 OZ OF JUICE OR REGULAR SODA OR ONE TUBE OF GLUCOSE GEL    STEP 2: RECHECK blood sugar in 15 MINUTES STEP 3: If your blood sugar is still low at the 15 minute recheck --> then, go back to STEP 1 and treat AGAIN with another 15 grams of carbohydrates.

## 2023-04-18 ENCOUNTER — Other Ambulatory Visit: Payer: Self-pay | Admitting: Nurse Practitioner

## 2023-04-18 ENCOUNTER — Encounter: Payer: Self-pay | Admitting: Nurse Practitioner

## 2023-04-18 DIAGNOSIS — N921 Excessive and frequent menstruation with irregular cycle: Secondary | ICD-10-CM

## 2023-04-18 DIAGNOSIS — N760 Acute vaginitis: Secondary | ICD-10-CM

## 2023-04-18 MED ORDER — FLUCONAZOLE 150 MG PO TABS
150.0000 mg | ORAL_TABLET | Freq: Every day | ORAL | 0 refills | Status: DC | PRN
Start: 2023-04-18 — End: 2023-06-26

## 2023-04-18 MED ORDER — MEGESTROL ACETATE 40 MG PO TABS: 40.0000 mg | ORAL_TABLET | Freq: Every day | ORAL | 0 refills | Status: DC

## 2023-06-09 ENCOUNTER — Ambulatory Visit: Payer: Commercial Managed Care - HMO

## 2023-06-16 ENCOUNTER — Ambulatory Visit (INDEPENDENT_AMBULATORY_CARE_PROVIDER_SITE_OTHER)

## 2023-06-16 DIAGNOSIS — Z3042 Encounter for surveillance of injectable contraceptive: Secondary | ICD-10-CM

## 2023-06-16 MED ORDER — MEDROXYPROGESTERONE ACETATE 150 MG/ML IM SUSP
150.0000 mg | Freq: Once | INTRAMUSCULAR | Status: AC
Start: 2023-06-16 — End: 2023-06-16
  Administered 2023-06-16: 150 mg via INTRAMUSCULAR

## 2023-06-24 ENCOUNTER — Encounter: Payer: Self-pay | Admitting: Nurse Practitioner

## 2023-06-26 ENCOUNTER — Other Ambulatory Visit: Payer: Self-pay | Admitting: Nurse Practitioner

## 2023-06-26 DIAGNOSIS — N760 Acute vaginitis: Secondary | ICD-10-CM

## 2023-06-26 MED ORDER — FLUCONAZOLE 150 MG PO TABS: 150.0000 mg | ORAL_TABLET | Freq: Every day | ORAL | 0 refills | Status: DC | PRN

## 2023-08-07 ENCOUNTER — Ambulatory Visit: Payer: Commercial Managed Care - HMO | Admitting: Internal Medicine

## 2023-08-07 NOTE — Progress Notes (Deleted)
 Name: Dawn Shields  Age/ Sex: 38 y.o., female   MRN/ DOB: [id number], 1985/12/21     PCP: Patient, No Pcp Per   Reason for Endocrinology Evaluation: Type 2 Diabetes Mellitus  Initial Endocrine Consultative Visit: 10/04/2018    PATIENT IDENTIFIER: Dawn Shields is a 38 y.o. female with a past medical history of DM, migraine headaches. The patient has followed with Endocrinology clinic since 10/04/2018 for consultative assistance with management of her diabetes.  DIABETIC HISTORY:  Dawn Shields was diagnosed with DM 2020, she was on insulin  for a brief time in 2020.  She did not tolerate Rybelsus -lightheadedness.  Her hemoglobin A1c has ranged from 6.2% in 2021, peaking at 11.9% in 2022.    She was followed by Dr. Washington Hacker from 2020 until 07/2021   On her initial visit with me 04/2022 she had an A1c of 9.5%, she was on glimepiride , metformin , and Farxiga .  I switched glimepiride  to glipizide   SUBJECTIVE:   During the last visit (04/07/2023): A1c 7.9%     Today (08/07/2023): Dawn Shields  She checks her blood sugars every other day times daily. The patient has not had hypoglycemic episodes since the last clinic visit.   The patient received a prescription for Diflucan  through her GYN office/2025 Denies nausea, vomiting  Denies  constipation or diarrhea  Denies lightheadedness    HOME DIABETES REGIMEN:  Metformin  500 mg XR, 2 tabs twice daily Glipizide  10 mg twice daily Farxiga  10 mg daily    Statin: Yes ACE-I/ARB: No   METER DOWNLOAD SUMMARY:   104 - 190 mg/dL    DIABETIC COMPLICATIONS: Microvascular complications:  Denies: CKD, retinopathy , neuropathy  Last Eye Exam: Completed 03/2022  Macrovascular complications:   Denies: CAD, CVA, PVD   HISTORY:  Past Medical History:  Past Medical History:  Diagnosis Date   Diabetes mellitus without complication (HCC)    Migraine    Past Surgical History: No past surgical history on file. Social History:   reports that she has never smoked. She has never used smokeless tobacco. She reports that she does not drink alcohol and does not use drugs. Family History:  Family History  Problem Relation Age of Onset   Diabetes Mother    Heart disease Mother      HOME MEDICATIONS: Allergies as of 08/07/2023       Reactions   Erythromycin Rash        Medication List        Accurate as of Aug 07, 2023  6:42 AM. If you have any questions, ask your nurse or doctor.          atorvastatin  10 MG tablet Commonly known as: LIPITOR TAKE 1 TABLET BY MOUTH EVERY DAY   dapagliflozin  propanediol 10 MG Tabs tablet Commonly known as: Farxiga  Take 1 tablet (10 mg total) by mouth daily before breakfast.   ferrous sulfate  325 (65 FE) MG tablet TAKE 1 TABLET BY MOUTH TWICE A DAY   fluconazole  150 MG tablet Commonly known as: DIFLUCAN  Take 1 tablet (150 mg total) by mouth daily as needed.   FreeStyle Libre 3 Sensor Misc 1 Device by Does not apply route every 14 (fourteen) days. Place 1 sensor on the skin every 14 days. Use to check glucose continuously   glipiZIDE  10 MG tablet Commonly known as: GLUCOTROL  Take 1 tablet (10 mg total) by mouth 2 (two) times daily before a meal.   megestrol  40 MG tablet Commonly known as: MEGACE  Take 1 tablet (  40 mg total) by mouth daily at 6 (six) AM.   metFORMIN  500 MG 24 hr tablet Commonly known as: GLUCOPHAGE -XR Take 2 tablets (1,000 mg total) by mouth 2 (two) times daily.   omeprazole  40 MG capsule Commonly known as: PRILOSEC TAKE 1 CAPSULE BY MOUTH EVERY DAY   OneTouch Delica Lancets 33G Misc Use to check blood sugar daily   OneTouch Verio test strip Generic drug: glucose blood 1 each by Other route daily in the afternoon. Use as instructed   OneTouch Verio w/Device Kit 1 Device by Does not apply route daily in the afternoon.   pseudoephedrine -guaifenesin  60-600 MG 12 hr tablet Commonly known as: Mucinex  D Take 1 tablet by mouth every 12  (twelve) hours.   spironolactone 50 MG tablet Commonly known as: ALDACTONE Take 50 mg by mouth daily.   topiramate  25 MG tablet Commonly known as: TOPAMAX  TAKE 1 TABLET BY MOUTH TWICE A DAY   tretinoin 0.025 % cream Commonly known as: RETIN-A SMARTSIG:Sparingly Topical Every Night PRN         OBJECTIVE:   Vital Signs: There were no vitals taken for this visit.  Wt Readings from Last 3 Encounters:  04/07/23 254 lb (115.2 kg)  12/07/22 255 lb 3.2 oz (115.8 kg)  11/24/22 252 lb (114.3 kg)     Exam: General: Pt appears well and is in NAD  Lungs: Clear with good BS bilat   Heart: RRR   Extremities: No pretibial edema.   Neuro: MS is good with appropriate affect, pt is alert and Ox3    DM foot exam: 04/07/2023  The skin of the feet is intact without sores or ulcerations. The pedal pulses are 2+ on right and 2+ on left. The sensation is intact to a screening 5.07, 10 gram monofilament bilaterally     DATA REVIEWED:  Lab Results  Component Value Date   HGBA1C 7.9 (A) 04/07/2023   HGBA1C 12.2 (A) 11/24/2022   HGBA1C 9.5 (A) 05/18/2022   Lab Results  Component Value Date   LDLCALC 99 05/12/2021   CREATININE 0.82 05/12/2021   No results found for: "MICRALBCREAT"   Lab Results  Component Value Date   CHOL 169 05/12/2021   HDL 37.10 (L) 05/12/2021   LDLCALC 99 05/12/2021   LDLDIRECT 113.0 05/12/2021   TRIG 165.0 (H) 05/12/2021   CHOLHDL 5 05/12/2021           ASSESSMENT / PLAN / RECOMMENDATIONS:   1) Type 2 Diabetes Mellitus, Sub-Optimally controlled, Without complications - Most recent A1c of 7.9%. Goal A1c < 7.0 %.    - I have praised the pt on improving glycemic control  -Patient declined insulin  -She declined trying another GLP-1 agonist, she was on Rybelsus  in the past and developed lightheadedness - No changes  - She will have labs through PCP soon    MEDICATIONS: Continue sure glipizide  10 mg, 1 tablet twice daily Continue  metformin   500 mg XR, 2 tabs twice daily Continue Farxiga  10 mg, 1 tablet daily  EDUCATION / INSTRUCTIONS: BG monitoring instructions: Patient is instructed to check her blood sugars 2 times a day. Call [facility] [place] [facility] if: BG persistently < 70  I reviewed the Rule of 15 for the treatment of hypoglycemia in detail with the patient. Literature supplied.    2) Diabetic complications:  Eye: Does not have known diabetic retinopathy.  Neuro/ Feet: Does not have known diabetic peripheral neuropathy .  Renal: Patient does not have known baseline CKD. She  is not on an ACEI/ARB at present.    F/U in 4 months      Signed electronically by: Natale Bail, MD  [facility] Endocrinology  [facility] [address] [place], [place] [address] Phone: [phone] FAX: [phone]   CC: Patient, No Pcp Per No address on file Phone: None  Fax: None  Return to Endocrinology clinic as below: Future Appointments  Date Time Provider Department Center  08/07/2023  8:30 AM Laurine Kuyper, Julian Obey, MD [facility] None  09/01/2023 10:15 AM [facility] NURSE [facility] None  12/11/2023 10:00 AM Andee Bamberger, NP [facility] None

## 2023-08-15 ENCOUNTER — Ambulatory Visit: Admitting: Internal Medicine

## 2023-08-15 ENCOUNTER — Encounter: Payer: Self-pay | Admitting: Internal Medicine

## 2023-08-15 NOTE — Progress Notes (Deleted)
 Name: Dawn Shields  Age/ Sex: 38 y.o., female   MRN/ DOB: [id number], 03-06-1986     PCP: Patient, No Pcp Per   Reason for Endocrinology Evaluation: Type 2 Diabetes Mellitus  Initial Endocrine Consultative Visit: 10/04/2018    PATIENT IDENTIFIER: Dawn Shields is a 38 y.o. female with a past medical history of DM, migraine headaches. The patient has followed with Endocrinology clinic since 10/04/2018 for consultative assistance with management of her diabetes.  DIABETIC HISTORY:  Dawn Shields was diagnosed with DM 2020, she was on insulin  for a brief time in 2020.  She did not tolerate Rybelsus -lightheadedness.  Her hemoglobin A1c has ranged from 6.2% in 2021, peaking at 11.9% in 2022.    She was followed by Dr. Washington Hacker from 2020 until 07/2021   On her initial visit with me 04/2022 she had an A1c of 9.5%, she was on glimepiride , metformin , and Farxiga .  I switched glimepiride  to glipizide   SUBJECTIVE:   During the last visit (04/07/2023): A1c 7.9%     Today (08/15/2023): Dawn Shields  She checks her blood sugars every other day times daily. The patient has not had hypoglycemic episodes since the last clinic visit.   The patient received a prescription for Diflucan  through her GYN office/2025 Denies nausea, vomiting  Denies  constipation or diarrhea  Denies lightheadedness    HOME DIABETES REGIMEN:  Metformin  500 mg XR, 2 tabs twice daily Glipizide  10 mg twice daily Farxiga  10 mg daily    Statin: Yes ACE-I/ARB: No   METER DOWNLOAD SUMMARY:   104 - 190 mg/dL    DIABETIC COMPLICATIONS: Microvascular complications:  Denies: CKD, retinopathy , neuropathy  Last Eye Exam: Completed 03/2022  Macrovascular complications:   Denies: CAD, CVA, PVD   HISTORY:  Past Medical History:  Past Medical History:  Diagnosis Date   Diabetes mellitus without complication (HCC)    Migraine    Past Surgical History: No past surgical history on file. Social History:   reports that she has never smoked. She has never used smokeless tobacco. She reports that she does not drink alcohol and does not use drugs. Family History:  Family History  Problem Relation Age of Onset   Diabetes Mother    Heart disease Mother      HOME MEDICATIONS: Allergies as of 08/15/2023       Reactions   Erythromycin Rash        Medication List        Accurate as of Aug 15, 2023  7:28 AM. If you have any questions, ask your nurse or doctor.          atorvastatin  10 MG tablet Commonly known as: LIPITOR TAKE 1 TABLET BY MOUTH EVERY DAY   dapagliflozin  propanediol 10 MG Tabs tablet Commonly known as: Farxiga  Take 1 tablet (10 mg total) by mouth daily before breakfast.   ferrous sulfate  325 (65 FE) MG tablet TAKE 1 TABLET BY MOUTH TWICE A DAY   fluconazole  150 MG tablet Commonly known as: DIFLUCAN  Take 1 tablet (150 mg total) by mouth daily as needed.   fluticasone 44 MCG/ACT inhaler Commonly known as: FLOVENT HFA 1 puff.   FreeStyle Libre 3 Sensor Misc 1 Device by Does not apply route every 14 (fourteen) days. Place 1 sensor on the skin every 14 days. Use to check glucose continuously   glipiZIDE  10 MG tablet Commonly known as: GLUCOTROL  Take 1 tablet (10 mg total) by mouth 2 (two) times daily before a meal.  ipratropium 0.06 % nasal spray Commonly known as: ATROVENT 2 sprays in each nostril Nasally Three times a day   megestrol  40 MG tablet Commonly known as: MEGACE  Take 1 tablet (40 mg total) by mouth daily at 6 (six) AM.   metFORMIN  500 MG 24 hr tablet Commonly known as: GLUCOPHAGE -XR Take 2 tablets (1,000 mg total) by mouth 2 (two) times daily.   omeprazole  40 MG capsule Commonly known as: PRILOSEC TAKE 1 CAPSULE BY MOUTH EVERY DAY   OneTouch Delica Lancets 33G Misc Use to check blood sugar daily   OneTouch Verio test strip Generic drug: glucose blood 1 each by Other route daily in the afternoon. Use as instructed   OneTouch Verio  w/Device Kit 1 Device by Does not apply route daily in the afternoon.   pseudoephedrine -guaifenesin  60-600 MG 12 hr tablet Commonly known as: Mucinex  D Take 1 tablet by mouth every 12 (twelve) hours.   spironolactone 50 MG tablet Commonly known as: ALDACTONE Take 50 mg by mouth daily.   topiramate  25 MG tablet Commonly known as: TOPAMAX  TAKE 1 TABLET BY MOUTH TWICE A DAY   tretinoin 0.025 % cream Commonly known as: RETIN-A SMARTSIG:Sparingly Topical Every Night PRN   trimethoprim-polymyxin b ophthalmic solution Commonly known as: POLYTRIM INSTILL 1 DROP INTO AFFECTED EYE 4 TIMES A DAY FOR 7 DAYS         OBJECTIVE:   Vital Signs: There were no vitals taken for this visit.  Wt Readings from Last 3 Encounters:  04/07/23 254 lb (115.2 kg)  12/07/22 255 lb 3.2 oz (115.8 kg)  11/24/22 252 lb (114.3 kg)     Exam: General: Pt appears well and is in NAD  Lungs: Clear with good BS bilat   Heart: RRR   Extremities: No pretibial edema.   Neuro: MS is good with appropriate affect, pt is alert and Ox3    DM foot exam: 04/07/2023  The skin of the feet is intact without sores or ulcerations. The pedal pulses are 2+ on right and 2+ on left. The sensation is intact to a screening 5.07, 10 gram monofilament bilaterally     DATA REVIEWED:  Lab Results  Component Value Date   HGBA1C 7.9 (A) 04/07/2023   HGBA1C 12.2 (A) 11/24/2022   HGBA1C 9.5 (A) 05/18/2022   Lab Results  Component Value Date   LDLCALC 99 05/12/2021   CREATININE 0.82 05/12/2021   No results found for: "MICRALBCREAT"   Lab Results  Component Value Date   CHOL 169 05/12/2021   HDL 37.10 (L) 05/12/2021   LDLCALC 99 05/12/2021   LDLDIRECT 113.0 05/12/2021   TRIG 165.0 (H) 05/12/2021   CHOLHDL 5 05/12/2021           ASSESSMENT / PLAN / RECOMMENDATIONS:   1) Type 2 Diabetes Mellitus, Sub-Optimally controlled, Without complications - Most recent A1c of 7.9%. Goal A1c < 7.0 %.    - I have  praised the pt on improving glycemic control  -Patient declined insulin  -She declined trying another GLP-1 agonist, she was on Rybelsus  in the past and developed lightheadedness - No changes  - She will have labs through PCP soon    MEDICATIONS: Continue glipizide  10 mg, 1 tablet twice daily Continue  metformin  500 mg XR, 2 tabs twice daily Continue Farxiga  10 mg, 1 tablet daily  EDUCATION / INSTRUCTIONS: BG monitoring instructions: Patient is instructed to check her blood sugars 2 times a day. Call [place] [facility] if: BG persistently < 70  I reviewed the Rule of 15 for the treatment of hypoglycemia in detail with the patient. Literature supplied.    2) Diabetic complications:  Eye: Does not have known diabetic retinopathy.  Neuro/ Feet: Does not have known diabetic peripheral neuropathy .  Renal: Patient does not have known baseline CKD. She   is not on an ACEI/ARB at present.    F/U in 4 months      Signed electronically by: Natale Bail, MD  [facility] Endocrinology  [facility] [address] [place], [place] [address] Phone: [phone] FAX: [phone]   CC: Patient, No Pcp Per No address on file Phone: None  Fax: None  Return to Endocrinology clinic as below: Future Appointments  Date Time Provider Department Center  08/15/2023 10:50 AM Demetric Parslow, Julian Obey, MD [facility] None  09/01/2023 10:15 AM [facility] NURSE [facility] None  12/11/2023 10:00 AM Andee Bamberger, NP [facility] None

## 2023-09-01 ENCOUNTER — Ambulatory Visit

## 2023-09-13 ENCOUNTER — Encounter: Payer: Self-pay | Admitting: Nurse Practitioner

## 2023-09-13 NOTE — Telephone Encounter (Signed)
 Spoke with patient.   Work in appt scheduled for 6/30 at 1345, arriving at 1330 with Tiffany. Patient aware to call if any new symptoms develop.   Encounter closed.

## 2023-09-13 NOTE — Telephone Encounter (Signed)
 Jami -please review and advise if patient can be worked into schedule.

## 2023-09-13 NOTE — Telephone Encounter (Signed)
 Ok to schedule OV with TW next week since there is no swelling, redness, fever/chills.

## 2023-09-15 ENCOUNTER — Ambulatory Visit (INDEPENDENT_AMBULATORY_CARE_PROVIDER_SITE_OTHER)

## 2023-09-15 ENCOUNTER — Ambulatory Visit

## 2023-09-15 DIAGNOSIS — Z3042 Encounter for surveillance of injectable contraceptive: Secondary | ICD-10-CM | POA: Diagnosis not present

## 2023-09-15 MED ORDER — MEDROXYPROGESTERONE ACETATE 150 MG/ML IM SUSY
150.0000 mg | PREFILLED_SYRINGE | Freq: Once | INTRAMUSCULAR | Status: AC
  Administered 2023-09-15: 150 mg via INTRAMUSCULAR

## 2023-09-18 ENCOUNTER — Encounter: Payer: Self-pay | Admitting: Nurse Practitioner

## 2023-09-18 ENCOUNTER — Ambulatory Visit (INDEPENDENT_AMBULATORY_CARE_PROVIDER_SITE_OTHER): Admitting: Nurse Practitioner

## 2023-09-18 VITALS — BP 120/80 | Resp 16 | Wt 250.0 lb

## 2023-09-18 DIAGNOSIS — L0291 Cutaneous abscess, unspecified: Secondary | ICD-10-CM | POA: Diagnosis not present

## 2023-09-18 DIAGNOSIS — N760 Acute vaginitis: Secondary | ICD-10-CM

## 2023-09-18 MED ORDER — FLUCONAZOLE 150 MG PO TABS: 150.0000 mg | ORAL_TABLET | Freq: Every day | ORAL | 0 refills | Status: AC | PRN

## 2023-09-18 NOTE — Progress Notes (Signed)
   Acute Office Visit  Subjective:    Patient ID: Dawn Shields, female    DOB: August 19, 1985, 38 y.o.   MRN: [id number]   HPI 38 y.o. presents today for breast pain x 3-4 days. Area below right areola was tender, raised and red. It has improved. Denies drainage. Requesting refill for Diflucan  for recurrent yeast.   No LMP recorded.    Review of Systems  Constitutional: Negative.   Right breast: Positive for skin change. Negative for lump, redness, nipple drainage.      Objective:    Physical Exam Constitutional:      Appearance: Normal appearance.  Chest:  Breasts:    Right: Skin change present. No swelling, bleeding, inverted nipple, mass, nipple discharge or tenderness.     Left: Normal.      BP 120/80 (Patient Position: Sitting, Cuff Size: Large)   Resp 16   Wt 250 lb (113.4 kg)   SpO2 98%   BMI 34.87 kg/m  Wt Readings from Last 3 Encounters:  09/18/23 250 lb (113.4 kg)  04/07/23 254 lb (115.2 kg)  12/07/22 255 lb 3.2 oz (115.8 kg)        Patient informed chaperone available to be present for breast exam. Patient has requested no chaperone to be present.   Assessment & Plan:   Problem List Items Addressed This Visit   None Visit Diagnoses       Cutaneous abscess, unspecified site    -  Primary     Recurrent vaginitis       Relevant Medications   fluconazole  (DIFLUCAN ) 150 MG tablet      Plan: Resolving abscess on right breast. Can apply warm compresses if needed. Return if area shows any signs of infection. Refill for Diflucan  provided.   Return if symptoms worsen or fail to improve.    Dawn DELENA Shutter DNP, 2:37 PM 09/18/2023

## 2023-09-25 ENCOUNTER — Ambulatory Visit: Admitting: Internal Medicine

## 2023-09-25 ENCOUNTER — Encounter: Payer: Self-pay | Admitting: Internal Medicine

## 2023-09-25 VITALS — BP 126/74 | HR 86 | Ht 71.0 in | Wt 249.0 lb

## 2023-09-25 DIAGNOSIS — Z7984 Long term (current) use of oral hypoglycemic drugs: Secondary | ICD-10-CM | POA: Diagnosis not present

## 2023-09-25 DIAGNOSIS — E1165 Type 2 diabetes mellitus with hyperglycemia: Secondary | ICD-10-CM

## 2023-09-25 DIAGNOSIS — Z7985 Long-term (current) use of injectable non-insulin antidiabetic drugs: Secondary | ICD-10-CM

## 2023-09-25 LAB — POCT GLYCOSYLATED HEMOGLOBIN (HGB A1C): Hemoglobin A1C: 8.5 % — AB (ref 4.0–5.6)

## 2023-09-25 LAB — POCT GLUCOSE (DEVICE FOR HOME USE): POC Glucose: 190 mg/dL — AB (ref 70–99)

## 2023-09-25 MED ORDER — TIRZEPATIDE 5 MG/0.5ML ~~LOC~~ SOAJ: 5.0000 mg | SUBCUTANEOUS | 3 refills | Status: DC

## 2023-09-25 MED ORDER — TIRZEPATIDE 2.5 MG/0.5ML ~~LOC~~ SOAJ: 2.5000 mg | SUBCUTANEOUS | Status: DC

## 2023-09-25 NOTE — Progress Notes (Signed)
 Name: Dawn Shields  Age/ Sex: 38 y.o., female   MRN/ DOB: [id number], Feb 23, 1986     PCP: Patient, No Pcp Per   Reason for Endocrinology Evaluation: Type 2 Diabetes Mellitus  Initial Endocrine Consultative Visit: 10/04/2018    PATIENT IDENTIFIER: Dawn Shields is a 38 y.o. female with a past medical history of DM, migraine headaches. The patient has followed with Endocrinology clinic since 10/04/2018 for consultative assistance with management of her diabetes.  DIABETIC HISTORY:  Dawn Shields was diagnosed with DM 2020, she was on insulin  for a brief time in 2020.  She did not tolerate Rybelsus -lightheadedness.  Her hemoglobin A1c has ranged from 6.2% in 2021, peaking at 11.9% in 2022.    She was followed by Dawn Shields from 2020 until 07/2021   On her initial visit with me 04/2022 she had an A1c of 9.5%, she was on glimepiride , metformin , and Farxiga .  I switched glimepiride  to glipizide   SUBJECTIVE:   During the last visit (04/07/2023): A1c 7.9%     Today (09/25/2023): Dawn Shields  She checks her blood sugars daily. The patient has not had hypoglycemic episodes since the last clinic visit.  Denies nausea, vomiting  Denies  constipation or diarrhea    HOME DIABETES REGIMEN:  Metformin  500 mg XR, 2 tabs twice daily Glipizide  10 mg twice daily Farxiga  10 mg daily    Statin: Yes ACE-I/ARB: No   METER DOWNLOAD SUMMARY:    DIABETIC COMPLICATIONS: Microvascular complications:  Denies: CKD, retinopathy , neuropathy  Last Eye Exam: Completed 03/2022  Macrovascular complications:   Denies: CAD, CVA, PVD   HISTORY:  Past Medical History:  Past Medical History:  Diagnosis Date   Diabetes mellitus without complication (HCC)    Migraine    Past Surgical History: No past surgical history on file. Social History:  reports that she has never smoked. She has never used smokeless tobacco. She reports that she does not drink alcohol and does not use drugs. Family  History:  Family History  Problem Relation Age of Onset   Diabetes Mother    Heart disease Mother      HOME MEDICATIONS: Allergies as of 09/25/2023       Reactions   Erythromycin Rash        Medication List        Accurate as of September 25, 2023  1:20 PM. If you have any questions, ask your nurse or doctor.          atorvastatin  10 MG tablet Commonly known as: LIPITOR TAKE 1 TABLET BY MOUTH EVERY DAY   dapagliflozin  propanediol 10 MG Tabs tablet Commonly known as: Farxiga  Take 1 tablet (10 mg total) by mouth daily before breakfast.   ferrous sulfate  325 (65 FE) MG tablet TAKE 1 TABLET BY MOUTH TWICE A DAY   fluconazole  150 MG tablet Commonly known as: DIFLUCAN  Take 1 tablet (150 mg total) by mouth daily as needed.   FreeStyle Libre 3 Sensor Misc 1 Device by Does not apply route every 14 (fourteen) days. Place 1 sensor on the skin every 14 days. Use to check glucose continuously   glipiZIDE  10 MG tablet Commonly known as: GLUCOTROL  Take 1 tablet (10 mg total) by mouth 2 (two) times daily before a meal.   ipratropium 0.06 % nasal spray Commonly known as: ATROVENT 2 sprays in each nostril Nasally Three times a day   megestrol  40 MG tablet Commonly known as: MEGACE  Take 1 tablet (40 mg total) by  mouth daily at 6 (six) AM.   metFORMIN  500 MG 24 hr tablet Commonly known as: GLUCOPHAGE -XR Take 2 tablets (1,000 mg total) by mouth 2 (two) times daily.   omeprazole  40 MG capsule Commonly known as: PRILOSEC TAKE 1 CAPSULE BY MOUTH EVERY DAY   OneTouch Delica Lancets 33G Misc Use to check blood sugar daily   OneTouch Verio test strip Generic drug: glucose blood 1 each by Other route daily in the afternoon. Use as instructed   OneTouch Verio w/Device Kit 1 Device by Does not apply route daily in the afternoon.   pseudoephedrine -guaifenesin  60-600 MG 12 hr tablet Commonly known as: Mucinex  D Take 1 tablet by mouth every 12 (twelve) hours. What changed:  when  to take this reasons to take this   spironolactone 50 MG tablet Commonly known as: ALDACTONE Take 50 mg by mouth daily.   tirzepatide  5 MG/0.5ML Pen Commonly known as: MOUNJARO  Inject 5 mg into the skin once a week. Started by: Donell PARAS Anise Harbin   topiramate  25 MG tablet Commonly known as: TOPAMAX  TAKE 1 TABLET BY MOUTH TWICE A DAY   tretinoin 0.025 % cream Commonly known as: RETIN-A SMARTSIG:Sparingly Topical Every Night PRN   trimethoprim-polymyxin b ophthalmic solution Commonly known as: POLYTRIM INSTILL 1 DROP INTO AFFECTED EYE 4 TIMES A DAY FOR 7 DAYS         OBJECTIVE:   Vital Signs: BP 126/74 (BP Location: Left Arm, Patient Position: Sitting, Cuff Size: Large)   Pulse 86   Ht 5' 11 (1.803 m)   Wt 249 lb (112.9 kg)   SpO2 98%   BMI 34.73 kg/m   Wt Readings from Last 3 Encounters:  09/25/23 249 lb (112.9 kg)  09/18/23 250 lb (113.4 kg)  04/07/23 254 lb (115.2 kg)     Exam: General: Pt appears well and is in NAD  Lungs: Clear with good BS bilat   Heart: RRR   Extremities: No pretibial edema.   Neuro: MS is good with appropriate affect, pt is alert and Ox3    DM foot exam: 04/07/2023  The skin of the feet is intact without sores or ulcerations. The pedal pulses are 2+ on right and 2+ on left. The sensation is intact to a screening 5.07, 10 gram monofilament bilaterally     DATA REVIEWED:  Lab Results  Component Value Date   HGBA1C 8.5 (A) 09/25/2023   HGBA1C 7.9 (A) 04/07/2023   HGBA1C 12.2 (A) 11/24/2022   Lab Results  Component Value Date   LDLCALC 99 05/12/2021   CREATININE 0.82 05/12/2021   No results found for: [facility]   Lab Results  Component Value Date   CHOL 169 05/12/2021   HDL 37.10 (L) 05/12/2021   LDLCALC 99 05/12/2021   LDLDIRECT 113.0 05/12/2021   TRIG 165.0 (H) 05/12/2021   CHOLHDL 5 05/12/2021       In office BG 190 mg/dL    ASSESSMENT / PLAN / RECOMMENDATIONS:   1) Type 2 Diabetes Mellitus,  Poorly controlled, Without complications - Most recent A1c of 8.5%. Goal A1c < 7.0 %.    -Patient continues with hyperglycemia - Patient has declined insulin  in the past -Rybelsus  caused lightheadedness - I have recommended starting GLP-1 agonist, caution against GI side effects, patient advised to check glucose if she is lightheaded to make sure she does not develop hypoglycemia.  Should she develop hypoglycemia will need to decrease glipizide  by 50% - She was provided with number for Mounjaro  2.5 mg  sample pens  MEDICATIONS: Start Mounjaro  2.5 mg weekly for 4 weeks, then increase to 5 mg weekly Continue glipizide  10 mg, 1 tablet twice daily Continue  metformin  500 mg XR, 2 tabs twice daily Continue Farxiga  10 mg, 1 tablet daily  EDUCATION / INSTRUCTIONS: BG monitoring instructions: Patient is instructed to check her blood sugars 2 times a day. Call [facility] if: BG persistently < 70  I reviewed the Rule of 15 for the treatment of hypoglycemia in detail with the patient. Literature supplied.    2) Diabetic complications:  Eye: Does not have known diabetic retinopathy.  Neuro/ Feet: Does not have known diabetic peripheral neuropathy .  Renal: Patient does not have known baseline CKD. She   is not on an ACEI/ARB at present.    F/U in 3 months      Signed electronically by: Stefano Redgie Butts, MD  [facility]  [facility] [address] [place], [place] [address] Phone: [phone] FAX: [phone]   CC: Patient, No Pcp Per No address on file Phone: None  Fax: None  Return to Endocrinology clinic as below: Future Appointments  Date Time Provider Department Center  12/01/2023  8:45 AM [facility] NURSE [facility] None  12/11/2023 10:00 AM Prentiss Annabella LABOR, NP [facility] None

## 2023-09-25 NOTE — Addendum Note (Signed)
 Addended by: OCTAVIO DIETRICH CROME on: 09/25/2023 01:44 PM   Modules accepted: Orders

## 2023-09-25 NOTE — Patient Instructions (Addendum)
 Start Mounjaro  2.5 mg once weekly for 4 weeks, than increase to 5 mg weekly   Continue Glipizide  10 mg, 1 tablet before Breakfast and Supper  Continue  Metformin  500 mg XR, 2 tablets twice daily  Continue Farxiga  10 mg, 1 tablet daily      HOW TO TREAT LOW BLOOD SUGARS (Blood sugar LESS THAN 70 MG/DL) Please follow the RULE OF 15 for the treatment of hypoglycemia treatment (when your (blood sugars are less than 70 mg/dL)   STEP 1: Take 15 grams of carbohydrates when your blood sugar is low, which includes:  3-4 GLUCOSE TABS  OR 3-4 OZ OF JUICE OR REGULAR SODA OR ONE TUBE OF GLUCOSE GEL    STEP 2: RECHECK blood sugar in 15 MINUTES STEP 3: If your blood sugar is still low at the 15 minute recheck --> then, go back to STEP 1 and treat AGAIN with another 15 grams of carbohydrates.

## 2023-12-01 ENCOUNTER — Ambulatory Visit

## 2023-12-07 ENCOUNTER — Other Ambulatory Visit: Payer: Self-pay

## 2023-12-07 DIAGNOSIS — Z7984 Long term (current) use of oral hypoglycemic drugs: Secondary | ICD-10-CM | POA: Insufficient documentation

## 2023-12-07 DIAGNOSIS — R1084 Generalized abdominal pain: Secondary | ICD-10-CM | POA: Insufficient documentation

## 2023-12-07 DIAGNOSIS — E119 Type 2 diabetes mellitus without complications: Secondary | ICD-10-CM | POA: Diagnosis not present

## 2023-12-07 DIAGNOSIS — R109 Unspecified abdominal pain: Secondary | ICD-10-CM | POA: Diagnosis present

## 2023-12-07 NOTE — ED Triage Notes (Addendum)
  Patient comes in with generalized abdominal pain that has been going on for 2 days.  Denies any N/V, or urinary symptoms.  Pain 9/10, sharp/stabbing.  Patient states she had a normal BM earlier in the day.  LMP 9/11.  Took OTC gas medication but did not help.   Currently taking Mounjaro  for weight loss and had dosage change within the last week.

## 2023-12-08 ENCOUNTER — Encounter (HOSPITAL_BASED_OUTPATIENT_CLINIC_OR_DEPARTMENT_OTHER): Payer: Self-pay | Admitting: Emergency Medicine

## 2023-12-08 ENCOUNTER — Encounter: Payer: Self-pay | Admitting: Internal Medicine

## 2023-12-08 ENCOUNTER — Emergency Department (HOSPITAL_BASED_OUTPATIENT_CLINIC_OR_DEPARTMENT_OTHER)
Admission: EM | Admit: 2023-12-08 | Discharge: 2023-12-08 | Disposition: A | Discharge status: Home / Self Care | Attending: Emergency Medicine | Admitting: Emergency Medicine

## 2023-12-08 DIAGNOSIS — R1084 Generalized abdominal pain: Secondary | ICD-10-CM

## 2023-12-08 LAB — COMPREHENSIVE METABOLIC PANEL WITH GFR
ALT: 8 U/L (ref 0–44)
AST: 12 U/L — ABNORMAL LOW (ref 15–41)
Albumin: 4.5 g/dL (ref 3.5–5.0)
Alkaline Phosphatase: 84 U/L (ref 38–126)
Anion gap: 15 (ref 5–15)
BUN: 9 mg/dL (ref 6–20)
CO2: 21 mmol/L — ABNORMAL LOW (ref 22–32)
Calcium: 9.6 mg/dL (ref 8.9–10.3)
Chloride: 99 mmol/L (ref 98–111)
Creatinine, Ser: 0.77 mg/dL (ref 0.44–1.00)
GFR, Estimated: >60 mL/min (ref >60)
Glucose, Bld: 164 mg/dL — ABNORMAL HIGH (ref 70–99)
Potassium: 3.6 mmol/L (ref 3.5–5.1)
Sodium: 135 mmol/L (ref 135–145)
Total Bilirubin: 0.5 mg/dL (ref 0.0–1.2)
Total Protein: 8 g/dL (ref 6.5–8.1)

## 2023-12-08 LAB — URINALYSIS, ROUTINE W REFLEX MICROSCOPIC
Bacteria, UA: NONE SEEN
Bilirubin Urine: NEGATIVE
Glucose, UA: NEGATIVE mg/dL
Ketones, ur: 15 mg/dL — AB
Leukocytes,Ua: NEGATIVE
Nitrite: NEGATIVE
Protein, ur: NEGATIVE mg/dL
Specific Gravity, Urine: 1.022 (ref 1.005–1.030)
pH: 6 (ref 5.0–8.0)

## 2023-12-08 LAB — CBC WITH DIFFERENTIAL/PLATELET
Abs Immature Granulocytes: 0.05 K/uL (ref 0.00–0.07)
Basophils Absolute: 0 K/uL (ref 0.0–0.1)
Basophils Relative: 0 %
Eosinophils Absolute: 0.4 K/uL (ref 0.0–0.5)
Eosinophils Relative: 5 %
HCT: 36.6 % (ref 36.0–46.0)
Hemoglobin: 11.9 g/dL — ABNORMAL LOW (ref 12.0–15.0)
Immature Granulocytes: 1 %
Lymphocytes Relative: 22 %
Lymphs Abs: 1.5 K/uL (ref 0.7–4.0)
MCH: 25.5 pg — ABNORMAL LOW (ref 26.0–34.0)
MCHC: 32.5 g/dL (ref 30.0–36.0)
MCV: 78.4 fL — ABNORMAL LOW (ref 80.0–100.0)
Monocytes Absolute: 0.7 K/uL (ref 0.1–1.0)
Monocytes Relative: 11 %
Neutro Abs: 4.1 K/uL (ref 1.7–7.7)
Neutrophils Relative %: 61 %
Platelets: 399 K/uL (ref 150–400)
RBC: 4.67 MIL/uL (ref 3.87–5.11)
RDW: 12.6 % (ref 11.5–15.5)
WBC: 6.7 K/uL (ref 4.0–10.5)
nRBC: 0 % (ref 0.0–0.2)

## 2023-12-08 LAB — PREGNANCY, URINE: Preg Test, Ur: NEGATIVE

## 2023-12-08 LAB — LIPASE, BLOOD: Lipase: 19 U/L (ref 11–51)

## 2023-12-08 MED ORDER — LACTATED RINGERS IV BOLUS
1000.0000 mL | Freq: Once | INTRAVENOUS | Status: AC
  Administered 2023-12-08: 1000 mL via INTRAVENOUS

## 2023-12-08 MED ORDER — GLIPIZIDE 10 MG PO TABS: ORAL_TABLET | ORAL | 3 refills | Status: DC

## 2023-12-08 MED ORDER — SIMETHICONE 40 MG/0.6ML PO LIQD: 0.6000 mL | Freq: Four times a day (QID) | ORAL | 0 refills | Status: AC | PRN

## 2023-12-09 NOTE — ED Provider Notes (Incomplete)
 Emergency Department Provider Note  TRIAGE NOTE:  Patient comes in with generalized abdominal pain that has been going on for 2 days.  Denies any N/V, or urinary symptoms.  Pain 9/10, sharp/stabbing.  Patient states she had a normal BM earlier in the day.  LMP 9/11.  Took OTC gas medication but did not help.   Currently taking Mounjaro  for weight loss and had dosage change within the last week.    HISTORY  Chief Complaint Abdominal Pain   HPI Dawn Shields is a 38 y.o. female with  a history of diabetes, currently on Mounjaro  for the past two months, who presents with abdominal pain persisting for the last two days. The pain is described as severe and worsening, located across the abdomen. The patient suspects the pain may be related to recent dietary changes or the increase in Mounjaro  dosage from 2.5 mg to 5 mg at the beginning of the month. The pain is exacerbated by lying down, and the patient reports difficulty finding a comfortable position. Bowel movements have been irregular, with decreased frequency and consistency, described as runny but minimal in amount. The patient denies fever, nausea, vomiting, chest pain, shortness of breath, and urinary symptoms. There is no history of abdominal surgeries, alcohol use, or smoking. The last menstrual period ended two weeks ago, and the patient is on Depo-Provera . The patient denies any recent trauma or use of other medications. History was obtained from the patient.   PMH Past Medical History:  Diagnosis Date  . Diabetes mellitus without complication (HCC)   . Migraine     Home Medications Prior to Admission medications   Medication Sig Start Date End Date Taking? Authorizing Provider  Simethicone  40 MG/0.6ML LIQD Take 0.6 mLs (40 mg total) by mouth every 6 (six) hours as needed. 12/08/23  Yes Adriann Ballweg, Selinda, MD  atorvastatin  (LIPITOR) 10 MG tablet TAKE 1 TABLET BY MOUTH EVERY DAY 05/12/22   Thedora Garnette HERO, MD  Blood Glucose  Monitoring Suppl (ONETOUCH VERIO) w/Device KIT 1 Device by Does not apply route daily in the afternoon. 11/24/22   Shamleffer, Ibtehal Jaralla, MD  Continuous Glucose Sensor (FREESTYLE LIBRE 3 SENSOR) MISC 1 Device by Does not apply route every 14 (fourteen) days. Place 1 sensor on the skin every 14 days. Use to check glucose continuously 11/24/22   Shamleffer, Ibtehal Jaralla, MD  dapagliflozin  propanediol (FARXIGA ) 10 MG TABS tablet Take 1 tablet (10 mg total) by mouth daily before breakfast. 11/24/22   Shamleffer, Donell Cardinal, MD  ferrous sulfate  325 (65 FE) MG tablet TAKE 1 TABLET BY MOUTH TWICE A DAY 09/02/22   Berneta Elsie Sayre, MD  fluconazole  (DIFLUCAN ) 150 MG tablet Take 1 tablet (150 mg total) by mouth daily as needed. 09/18/23   Prentiss Annabella DELENA, NP  glipiZIDE  (GLUCOTROL ) 10 MG tablet Take 2 tablets (20 mg total) by mouth daily before breakfast AND 1 tablet (10 mg total) daily before supper. 12/08/23   Shamleffer, Ibtehal Jaralla, MD  glucose blood (ONETOUCH VERIO) test strip 1 each by Other route daily in the afternoon. Use as instructed 11/24/22   Shamleffer, Ibtehal Jaralla, MD  ipratropium (ATROVENT) 0.06 % nasal spray 2 sprays in each nostril Nasally Three times a day 06/05/23   [provider]  megestrol  (MEGACE ) 40 MG tablet Take 1 tablet (40 mg total) by mouth daily at 6 (six) AM. 04/18/23   Prentiss Annabella DELENA, NP  metFORMIN  (GLUCOPHAGE -XR) 500 MG 24 hr tablet Take 2 tablets (1,000 mg  total) by mouth 2 (two) times daily. 11/24/22   Shamleffer, Ibtehal Jaralla, MD  omeprazole  (PRILOSEC) 40 MG capsule TAKE 1 CAPSULE BY MOUTH EVERY DAY 09/02/22   Berneta Elsie Sayre, MD  OneTouch Delica Lancets 33G MISC Use to check blood sugar daily 01/20/23   Shamleffer, Donell Cardinal, MD  pseudoephedrine -guaifenesin  (MUCINEX  D) 60-600 MG 12 hr tablet Take 1 tablet by mouth every 12 (twelve) hours. Patient taking differently: Take 1 tablet by mouth as needed. 10/05/21   Berneta Elsie Sayre,  MD  spironolactone (ALDACTONE) 50 MG tablet Take 50 mg by mouth daily. 04/26/22   [provider]  topiramate  (TOPAMAX ) 25 MG tablet TAKE 1 TABLET BY MOUTH TWICE A DAY 05/15/22   Berneta Elsie Sayre, MD  tretinoin (RETIN-A) 0.025 % cream SMARTSIG:Sparingly Topical Every Night PRN 04/27/22   [provider]  trimethoprim-polymyxin b (POLYTRIM) ophthalmic solution INSTILL 1 DROP INTO AFFECTED EYE 4 TIMES A DAY FOR 7 DAYS 06/28/23   [provider]    Social History Social History   Tobacco Use  . Smoking status: Never  . Smokeless tobacco: Never  Vaping Use  . Vaping status: Never Used  Substance Use Topics  . Alcohol use: No  . Drug use: No    Review of Systems: Documented in HPI ____________________________________________  PHYSICAL EXAM: VITAL SIGNS: Triage: Blood pressure 123/81, pulse (!) 106, temperature 98 F (36.7 C), resp. rate 19, height 5' 11 (1.803 m), weight 111.6 kg, last menstrual period 11/30/2023, SpO2 99%.  Vitals:   12/07/23 2359 12/08/23 0200 12/08/23 0230 12/08/23 0300  BP:  102/72 120/78 123/81  Pulse:  (!) 106 (!) 102 (!) 106  Resp:      Temp:      SpO2:  99% 99% 99%  Weight: 111.6 kg     Height: 5' 11 (1.803 m)       Physical Exam Vitals and nursing note reviewed.  Constitutional:      Appearance: She is well-developed.  HENT:     Head: Normocephalic and atraumatic.  Cardiovascular:     Rate and Rhythm: Normal rate and regular rhythm.  Pulmonary:     Effort: No respiratory distress.     Breath sounds: No stridor.  Abdominal:     General: There is no distension.     Tenderness: There is no abdominal tenderness.  Musculoskeletal:     Cervical back: Normal range of motion.  Neurological:     Mental Status: She is alert.       ____________________________________________   LABS (all labs ordered are listed, but only abnormal results are displayed)  Labs Reviewed  URINALYSIS, ROUTINE W REFLEX MICROSCOPIC -  Abnormal; Notable for the following components:      Result Value   Hgb urine dipstick MODERATE (*)    Ketones, ur 15 (*)    All other components within normal limits  CBC WITH DIFFERENTIAL/PLATELET - Abnormal; Notable for the following components:   Hemoglobin 11.9 (*)    MCV 78.4 (*)    MCH 25.5 (*)    All other components within normal limits  COMPREHENSIVE METABOLIC PANEL WITH GFR - Abnormal; Notable for the following components:   CO2 21 (*)    Glucose, Bld 164 (*)    AST 12 (*)    All other components within normal limits  LIPASE, BLOOD  PREGNANCY, URINE   ____________________________________________  EKG   EKG Interpretation Date/Time:    Ventricular Rate:    PR Interval:  QRS Duration:    QT Interval:    QTC Calculation:   R Axis:      Text Interpretation:          ____________________________________________  RADIOLOGY  No results found. ____________________________________________  PROCEDURES  Procedure(s) performed:   Procedures ____________________________________________  INITIAL IMPRESSION / ASSESSMENT AND PLAN   Initial Evaluation: Patient has had abdominal pain for the last two days, possibly related to Mounjaro  medication for diabetes. Plan: Order blood tests including pancreas test (lipase), electrolytes, and liver tests Administer IV fluids Urine analysis Consider CT scan if necessary Re-evaluation 1: (2023-12-08 02:56 AM EDT) Lipase and pancreas test are normal Electrolytes and liver tests are normal Blood in urine discussed, CT scan considered to rule out kidney stone Patient feels better, likely related to medication effects Simethicone  (Gas-X) recommended Heart rate still slightly high, dehydration noted Discussed medication side effects and potential options with patient  Images ordered viewed and obtained by myself. Agree with Radiology interpretation. Details in ED course.  Labs ordered reviewed by myself as detailed in  ED course.  Consultations obtained/considered detailed in ED course.    FINAL IMPRESSION Final diagnoses:  Generalized abdominal pain     Disposition  A medical screening exam was performed and I feel the patient has had an appropriate workup for their chief complaint at this time and likelihood of emergent condition existing is low. They have been counseled on decision, DISCHARGE, follow up and which symptoms necessitate immediate return to the emergency department. They or their family verbally stated understanding and agreement with plan and discharged in stable condition.   ____________________________________________   NEW OUTPATIENT MEDICATIONS STARTED DURING THIS VISIT:  Discharge Medication List as of 12/08/2023  3:14 AM     START taking these medications   Details  Simethicone  40 MG/0.6ML LIQD Take 0.6 mLs (40 mg total) by mouth every 6 (six) hours as needed., Starting Fri 12/08/2023, Normal        Note:  This note was prepared with assistance of Dragon voice recognition software. Occasional wrong-word or sound-a-like substitutions may have occurred due to the inherent limitations of voice recognition software.

## 2023-12-09 NOTE — ED Provider Notes (Signed)
 Emergency Department Provider Note  TRIAGE NOTE:  Patient comes in with generalized abdominal pain that has been going on for 2 days.  Denies any N/V, or urinary symptoms.  Pain 9/10, sharp/stabbing.  Patient states she had a normal BM earlier in the day.  LMP 9/11.  Took OTC gas medication but did not help.   Currently taking Mounjaro  for weight loss and had dosage change within the last week.    HISTORY  Chief Complaint Abdominal Pain   HPI Dawn Shields is a 38 y.o. female with  a history of diabetes, currently on Mounjaro  for the past two months, who presents with abdominal pain persisting for the last two days. The pain is described as severe and worsening, located across the abdomen. The patient suspects the pain may be related to recent dietary changes or the increase in Mounjaro  dosage from 2.5 mg to 5 mg at the beginning of the month. The pain is exacerbated by lying down, and the patient reports difficulty finding a comfortable position. Bowel movements have been irregular, with decreased frequency and consistency, described as runny but minimal in amount. The patient denies fever, nausea, vomiting, chest pain, shortness of breath, and urinary symptoms. There is no history of abdominal surgeries, alcohol use, or smoking. The last menstrual period ended two weeks ago, and the patient is on Depo-Provera . The patient denies any recent trauma or use of other medications. History was obtained from the patient.   PMH Past Medical History:  Diagnosis Date   Diabetes mellitus without complication (HCC)    Migraine     Home Medications Prior to Admission medications   Medication Sig Start Date End Date Taking? Authorizing Provider  Simethicone  40 MG/0.6ML LIQD Take 0.6 mLs (40 mg total) by mouth every 6 (six) hours as needed. 12/08/23  Yes Jenayah Antu, Selinda, MD  atorvastatin  (LIPITOR) 10 MG tablet TAKE 1 TABLET BY MOUTH EVERY DAY 05/12/22   Thedora Garnette HERO, MD  Blood Glucose Monitoring  Suppl (ONETOUCH VERIO) w/Device KIT 1 Device by Does not apply route daily in the afternoon. 11/24/22   Shamleffer, Ibtehal Jaralla, MD  Continuous Glucose Sensor (FREESTYLE LIBRE 3 SENSOR) MISC 1 Device by Does not apply route every 14 (fourteen) days. Place 1 sensor on the skin every 14 days. Use to check glucose continuously 11/24/22   Shamleffer, Ibtehal Jaralla, MD  dapagliflozin  propanediol (FARXIGA ) 10 MG TABS tablet Take 1 tablet (10 mg total) by mouth daily before breakfast. 11/24/22   Shamleffer, Ibtehal Jaralla, MD  ferrous sulfate  325 (65 FE) MG tablet TAKE 1 TABLET BY MOUTH TWICE A DAY 09/02/22   Berneta Elsie Sayre, MD  fluconazole  (DIFLUCAN ) 150 MG tablet Take 1 tablet (150 mg total) by mouth daily as needed. 09/18/23   Prentiss Annabella DELENA, NP  glipiZIDE  (GLUCOTROL ) 10 MG tablet Take 2 tablets (20 mg total) by mouth daily before breakfast AND 1 tablet (10 mg total) daily before supper. 12/08/23   Shamleffer, Ibtehal Jaralla, MD  glucose blood (ONETOUCH VERIO) test strip 1 each by Other route daily in the afternoon. Use as instructed 11/24/22   Shamleffer, Ibtehal Jaralla, MD  ipratropium (ATROVENT) 0.06 % nasal spray 2 sprays in each nostril Nasally Three times a day 06/05/23   [provider]  megestrol  (MEGACE ) 40 MG tablet Take 1 tablet (40 mg total) by mouth daily at 6 (six) AM. 04/18/23   Prentiss Annabella DELENA, NP  metFORMIN  (GLUCOPHAGE -XR) 500 MG 24 hr tablet Take 2 tablets (1,000 mg  total) by mouth 2 (two) times daily. 11/24/22   Shamleffer, Ibtehal Jaralla, MD  omeprazole  (PRILOSEC) 40 MG capsule TAKE 1 CAPSULE BY MOUTH EVERY DAY 09/02/22   Berneta Elsie Sayre, MD  OneTouch Delica Lancets 33G MISC Use to check blood sugar daily 01/20/23   Shamleffer, Donell Cardinal, MD  pseudoephedrine -guaifenesin  (MUCINEX  D) 60-600 MG 12 hr tablet Take 1 tablet by mouth every 12 (twelve) hours. Patient taking differently: Take 1 tablet by mouth as needed. 10/05/21   Berneta Elsie Sayre, MD   spironolactone (ALDACTONE) 50 MG tablet Take 50 mg by mouth daily. 04/26/22   [provider]  topiramate  (TOPAMAX ) 25 MG tablet TAKE 1 TABLET BY MOUTH TWICE A DAY 05/15/22   Berneta Elsie Sayre, MD  tretinoin (RETIN-A) 0.025 % cream SMARTSIG:Sparingly Topical Every Night PRN 04/27/22   [provider]  trimethoprim-polymyxin b (POLYTRIM) ophthalmic solution INSTILL 1 DROP INTO AFFECTED EYE 4 TIMES A DAY FOR 7 DAYS 06/28/23   [provider]    Social History Social History   Tobacco Use   Smoking status: Never   Smokeless tobacco: Never  Vaping Use   Vaping status: Never Used  Substance Use Topics   Alcohol use: No   Drug use: No    Review of Systems: Documented in HPI ____________________________________________  PHYSICAL EXAM: VITAL SIGNS: Triage: Blood pressure 123/81, pulse (!) 106, temperature 98 F (36.7 C), resp. rate 19, height 5' 11 (1.803 m), weight 111.6 kg, last menstrual period 11/30/2023, SpO2 99%.  Vitals:   12/07/23 2359 12/08/23 0200 12/08/23 0230 12/08/23 0300  BP:  102/72 120/78 123/81  Pulse:  (!) 106 (!) 102 (!) 106  Resp:      Temp:      SpO2:  99% 99% 99%  Weight: 111.6 kg     Height: 5' 11 (1.803 m)       Physical Exam Vitals and nursing note reviewed.  Constitutional:      Appearance: She is well-developed.  HENT:     Head: Normocephalic and atraumatic.  Cardiovascular:     Rate and Rhythm: Normal rate and regular rhythm.  Pulmonary:     Effort: No respiratory distress.     Breath sounds: No stridor.  Abdominal:     General: There is no distension.     Tenderness: There is no abdominal tenderness.  Musculoskeletal:     Cervical back: Normal range of motion.  Neurological:     Mental Status: She is alert.       ____________________________________________   LABS (all labs ordered are listed, but only abnormal results are displayed)  Labs Reviewed  URINALYSIS, ROUTINE W REFLEX MICROSCOPIC - Abnormal;  Notable for the following components:      Result Value   Hgb urine dipstick MODERATE (*)    Ketones, ur 15 (*)    All other components within normal limits  CBC WITH DIFFERENTIAL/PLATELET - Abnormal; Notable for the following components:   Hemoglobin 11.9 (*)    MCV 78.4 (*)    MCH 25.5 (*)    All other components within normal limits  COMPREHENSIVE METABOLIC PANEL WITH GFR - Abnormal; Notable for the following components:   CO2 21 (*)    Glucose, Bld 164 (*)    AST 12 (*)    All other components within normal limits  LIPASE, BLOOD  PREGNANCY, URINE   ____________________________________________  EKG   EKG Interpretation Date/Time:    Ventricular Rate:    PR Interval:  QRS Duration:    QT Interval:    QTC Calculation:   R Axis:      Text Interpretation:          ____________________________________________  RADIOLOGY  No results found. ____________________________________________  PROCEDURES  Procedure(s) performed:   Procedures ____________________________________________  INITIAL IMPRESSION / ASSESSMENT AND PLAN   Initial Evaluation: Patient has had abdominal pain for the last two days, possibly related to Mounjaro  medication for diabetes. Plan: Order blood tests including pancreas test (lipase), electrolytes, and liver tests Administer IV fluids Urine analysis, doubt UTI Consider CT scan if necessary Re-evaluation 1: (2023-12-08 02:56 AM EDT) Lipase and pancreas test are normal, doubt pancreatitis Electrolytes and liver tests are normal Blood in urine discussed, CT scan considered to rule out kidney stone Patient feels better, likely related to medication effects Simethicone  (Gas-X) recommended Heart rate still slightly high, dehydration noted Discussed medication side effects and potential options with patient  Images ordered viewed and obtained by myself. Agree with Radiology interpretation. Details in ED course.  Labs ordered reviewed by  myself as detailed in ED course.  Consultations obtained/considered detailed in ED course.    FINAL IMPRESSION Final diagnoses:  Generalized abdominal pain     Disposition  A medical screening exam was performed and I feel the patient has had an appropriate workup for their chief complaint at this time and likelihood of emergent condition existing is low. They have been counseled on decision, DISCHARGE, follow up and which symptoms necessitate immediate return to the emergency department. They or their family verbally stated understanding and agreement with plan and discharged in stable condition.   ____________________________________________   NEW OUTPATIENT MEDICATIONS STARTED DURING THIS VISIT:  Discharge Medication List as of 12/08/2023  3:14 AM     START taking these medications   Details  Simethicone  40 MG/0.6ML LIQD Take 0.6 mLs (40 mg total) by mouth every 6 (six) hours as needed., Starting Fri 12/08/2023, Normal        Note:  This note was prepared with assistance of Dragon voice recognition software. Occasional wrong-word or sound-a-like substitutions may have occurred due to the inherent limitations of voice recognition software.    Arloa Prak, Selinda, MD 12/10/23 [phone]

## 2023-12-11 ENCOUNTER — Ambulatory Visit: Payer: Commercial Managed Care - HMO | Admitting: Nurse Practitioner

## 2023-12-19 ENCOUNTER — Other Ambulatory Visit: Payer: Self-pay | Admitting: Internal Medicine

## 2023-12-19 DIAGNOSIS — E1165 Type 2 diabetes mellitus with hyperglycemia: Secondary | ICD-10-CM

## 2023-12-27 ENCOUNTER — Ambulatory Visit: Admitting: Internal Medicine

## 2024-01-11 ENCOUNTER — Ambulatory Visit: Admitting: Nurse Practitioner

## 2024-01-11 NOTE — Progress Notes (Unsigned)
 Name: Dawn Shields  Age/ Sex: 38 y.o., female   MRN/ DOB: [id number], Aug 06, 1985     PCP: Patient, No Pcp Per   Reason for Endocrinology Evaluation: Type 2 Diabetes Mellitus  Initial Endocrine Consultative Visit: 10/04/2018    PATIENT IDENTIFIER: Dawn Shields is a 38 y.o. female with a past medical history of DM, migraine headaches. The patient has followed with Endocrinology clinic since 10/04/2018 for consultative assistance with management of her diabetes.  DIABETIC HISTORY:  Ms. Ehmann was diagnosed with DM 2020, she was on insulin  for a brief time in 2020.  She did not tolerate Rybelsus -lightheadedness.  Her hemoglobin A1c has ranged from 6.2% in 2021, peaking at 11.9% in 2022.    She was followed by Dr. Kassie from 2020 until 07/2021   On her initial visit with me 04/2022 she had an A1c of 9.5%, she was on glimepiride , metformin , and Farxiga .  I switched glimepiride  to glipizide    I prescribed Mounjaro  in July, 2025 but she developed severe abdominal pain and constipation requiring urgent care visit, we discontinued Mounjaro  in September, 2025  SUBJECTIVE:   During the last visit (09/25/2023): A1c 8.5%     Today (01/12/2024): Dawn Shields  She checks her blood sugars daily. The patient has not had hypoglycemic episodes since the last clinic visit.  No nausea  or vomiting  No constipation or diarrhea  She has not been taking Farxiga , she was under the impression that because she started Mounjaro  she will discontinue the Farxiga , patient did not restart Farxiga  despite being off Mounjaro   She does not drink any sugar sweetened beverages She does snack She does use Megace  sporadically  HOME DIABETES REGIMEN:  Metformin  500 mg XR, 2 tabs twice daily Glipizide  10 mg, 2 tabs with breakfast and 1 tab with supper Farxiga  10 mg daily-not taking    Statin: Yes ACE-I/ARB: No   METER DOWNLOAD SUMMARY: n/a   DIABETIC COMPLICATIONS: Microvascular complications:   Denies: CKD, retinopathy , neuropathy  Last Eye Exam: Completed 03/2022  Macrovascular complications:   Denies: CAD, CVA, PVD   HISTORY:  Past Medical History:  Past Medical History:  Diagnosis Date   Diabetes mellitus without complication (HCC)    Migraine    Past Surgical History: No past surgical history on file. Social History:  reports that she has never smoked. She has never used smokeless tobacco. She reports that she does not drink alcohol and does not use drugs. Family History:  Family History  Problem Relation Age of Onset   Diabetes Mother    Heart disease Mother      HOME MEDICATIONS: Allergies as of 01/12/2024       Reactions   Erythromycin Rash        Medication List        Accurate as of January 12, 2024 12:08 PM. If you have any questions, ask your nurse or doctor.          atorvastatin  10 MG tablet Commonly known as: LIPITOR TAKE 1 TABLET BY MOUTH EVERY DAY   dapagliflozin  propanediol 10 MG Tabs tablet Commonly known as: Farxiga  Take 1 tablet (10 mg total) by mouth daily before breakfast.   ferrous sulfate  325 (65 FE) MG tablet TAKE 1 TABLET BY MOUTH TWICE A DAY   fluconazole  150 MG tablet Commonly known as: DIFLUCAN  Take 1 tablet (150 mg total) by mouth daily as needed.   FreeStyle Libre 3 Sensor Misc 1 Device by Does not apply route every  14 (fourteen) days. Place 1 sensor on the skin every 14 days. Use to check glucose continuously   glipiZIDE  10 MG tablet Commonly known as: GLUCOTROL  Take 2 tablets (20 mg total) by mouth daily before breakfast AND 1 tablet (10 mg total) daily before supper.   ipratropium 0.06 % nasal spray Commonly known as: ATROVENT 2 sprays in each nostril Nasally Three times a day   megestrol  40 MG tablet Commonly known as: MEGACE  Take 1 tablet (40 mg total) by mouth daily at 6 (six) AM.   metFORMIN  500 MG 24 hr tablet Commonly known as: GLUCOPHAGE -XR TAKE 2 TABLETS BY MOUTH TWICE A DAY    omeprazole  40 MG capsule Commonly known as: PRILOSEC TAKE 1 CAPSULE BY MOUTH EVERY DAY   OneTouch Delica Lancets 33G Misc Use to check blood sugar daily   OneTouch Verio test strip Generic drug: glucose blood 1 each by Other route daily in the afternoon. Use as instructed   OneTouch Verio w/Device Kit 1 Device by Does not apply route daily in the afternoon.   pseudoephedrine -guaifenesin  60-600 MG 12 hr tablet Commonly known as: Mucinex  D Take 1 tablet by mouth every 12 (twelve) hours. What changed:  when to take this reasons to take this   Simethicone  40 MG/0.6ML Liqd Take 0.6 mLs (40 mg total) by mouth every 6 (six) hours as needed.   spironolactone 50 MG tablet Commonly known as: ALDACTONE Take 50 mg by mouth daily.   topiramate  25 MG tablet Commonly known as: TOPAMAX  TAKE 1 TABLET BY MOUTH TWICE A DAY   tretinoin 0.025 % cream Commonly known as: RETIN-A SMARTSIG:Sparingly Topical Every Night PRN   trimethoprim-polymyxin b ophthalmic solution Commonly known as: POLYTRIM INSTILL 1 DROP INTO AFFECTED EYE 4 TIMES A DAY FOR 7 DAYS         OBJECTIVE:   Vital Signs: BP 124/72 (BP Location: Left Arm, Patient Position: Sitting, Cuff Size: Normal)   Pulse 98   Ht 5' 11 (1.803 m)   Wt 250 lb 6.4 oz (113.6 kg)   SpO2 99%   BMI 34.92 kg/m   Wt Readings from Last 3 Encounters:  01/12/24 250 lb 6.4 oz (113.6 kg)  12/07/23 246 lb (111.6 kg)  09/25/23 249 lb (112.9 kg)     Exam: General: Pt appears well and is in NAD  Lungs: Clear with good BS bilat   Heart: RRR   Extremities: No pretibial edema.   Neuro: MS is good with appropriate affect, pt is alert and Ox3    DM foot exam: 01/12/2024  The skin of the feet is intact without sores or ulcerations. The pedal pulses are 2+ on right and 2+ on left. The sensation is intact to a screening 5.07, 10 gram monofilament bilaterally     DATA REVIEWED:  Lab Results  Component Value Date   HGBA1C 8.3 (A)  01/12/2024   HGBA1C 8.5 (A) 09/25/2023   HGBA1C 7.9 (A) 04/07/2023    Latest Reference Range & Units 12/08/23 01:49  Comprehensive metabolic panel with GFR  Rpt !  Sodium 135 - 145 mmol/L 135  Potassium 3.5 - 5.1 mmol/L 3.6  Chloride 98 - 111 mmol/L 99  CO2 22 - 32 mmol/L 21 (L)  Glucose 70 - 99 mg/dL 835 (H)  BUN 6 - 20 mg/dL 9  Creatinine 9.55 - 8.99 mg/dL 9.22  Calcium  8.9 - 10.3 mg/dL 9.6  Anion gap 5 - 15  15  Alkaline Phosphatase 38 - 126 U/L 84  Albumin 3.5 - 5.0 g/dL 4.5  Lipase 11 - 51 U/L 19  AST 15 - 41 U/L 12 (L)  ALT 0 - 44 U/L 8  Total Protein 6.5 - 8.1 g/dL 8.0  Total Bilirubin 0.0 - 1.2 mg/dL 0.5  GFR, Estimated >39 mL/min >60   In office BG 251 MGs/DL  ASSESSMENT / PLAN / RECOMMENDATIONS:   1) Type 2 Diabetes Mellitus, Poorly controlled, Without complications - Most recent A1c of 8.3%. Goal A1c < 7.0 %.    -Patient continues with hyperglycemia, A1c has trended down from 8.5% to 8.3% - Patient has declined insulin  in the past -Rybelsus  caused lightheadedness - Mounjaro  caused abdominal pain and had to discontinue, we entertained Ozempic/Trulicity in the future -I will refer to our CDE for proper education about a low-carb diet -Will increase glipizide  as below - Patient advised to restart Farxiga     MEDICATIONS: Increase glipizide  10 mg, 2 tablet twice daily Continue  metformin  500 mg XR, 2 tabs twice daily Restart Farxiga  10 mg, 1 tablet daily  EDUCATION / INSTRUCTIONS: BG monitoring instructions: Patient is instructed to check her blood sugars 2 times a day. Call [facility] if: BG persistently < 70  I reviewed the Rule of 15 for the treatment of hypoglycemia in detail with the patient. Literature supplied.    2) Diabetic complications:  Eye: Does not have known diabetic retinopathy.  Neuro/ Feet: Does not have known diabetic peripheral neuropathy .  Renal: Patient does not have known baseline CKD. She   is not on an ACEI/ARB  at present.    F/U in 4 months      Signed electronically by: Stefano Redgie Butts, MD  [facility] End[facility]  [facility] [address] [place], [place] [address] Phone: [phone] FAX: [phone]   CC: Patient, No Pcp Per No address on file Phone: None  Fax: None  Return to Endocrinology clinic as below: No future appointments.

## 2024-01-12 ENCOUNTER — Ambulatory Visit: Admitting: Internal Medicine

## 2024-01-12 ENCOUNTER — Encounter: Payer: Self-pay | Admitting: Internal Medicine

## 2024-01-12 VITALS — BP 124/72 | HR 98 | Ht 71.0 in | Wt 250.4 lb

## 2024-01-12 DIAGNOSIS — Z7984 Long term (current) use of oral hypoglycemic drugs: Secondary | ICD-10-CM

## 2024-01-12 DIAGNOSIS — E1165 Type 2 diabetes mellitus with hyperglycemia: Secondary | ICD-10-CM

## 2024-01-12 LAB — POCT GLUCOSE (DEVICE FOR HOME USE): POC Glucose: 253 mg/dL — AB (ref 70–99)

## 2024-01-12 LAB — POCT GLYCOSYLATED HEMOGLOBIN (HGB A1C): Hemoglobin A1C: 8.3 % — AB (ref 4.0–5.6)

## 2024-01-12 MED ORDER — DAPAGLIFLOZIN PROPANEDIOL 10 MG PO TABS: 10.0000 mg | ORAL_TABLET | Freq: Every day | ORAL | 3 refills | Status: AC

## 2024-01-12 MED ORDER — GLIPIZIDE 10 MG PO TABS: 20.0000 mg | ORAL_TABLET | Freq: Two times a day (BID) | ORAL | 3 refills | Status: AC

## 2024-01-12 MED ORDER — METFORMIN HCL ER 500 MG PO TB24: 1000.0000 mg | ORAL_TABLET | Freq: Two times a day (BID) | ORAL | 2 refills | Status: AC

## 2024-01-12 NOTE — Patient Instructions (Addendum)
 Change glipizide  10 mg, 2 tablets before Breakfast and Supper  Continue  Metformin  500 mg XR, 2 tablets twice daily  Restart Farxiga  10 mg, 1 tablet daily      HOW TO TREAT LOW BLOOD SUGARS (Blood sugar LESS THAN 70 MG/DL) Please follow the RULE OF 15 for the treatment of hypoglycemia treatment (when your (blood sugars are less than 70 mg/dL)   STEP 1: Take 15 grams of carbohydrates when your blood sugar is low, which includes:  3-4 GLUCOSE TABS  OR 3-4 OZ OF JUICE OR REGULAR SODA OR ONE TUBE OF GLUCOSE GEL    STEP 2: RECHECK blood sugar in 15 MINUTES STEP 3: If your blood sugar is still low at the 15 minute recheck --> then, go back to STEP 1 and treat AGAIN with another 15 grams of carbohydrates.

## 2024-04-02 ENCOUNTER — Encounter: Payer: Self-pay | Admitting: Internal Medicine

## 2024-04-02 ENCOUNTER — Encounter: Payer: Self-pay | Admitting: Nurse Practitioner

## 2024-04-02 DIAGNOSIS — E1165 Type 2 diabetes mellitus with hyperglycemia: Secondary | ICD-10-CM

## 2024-04-03 MED ORDER — ONETOUCH VERIO VI STRP: 1.0000 | ORAL_STRIP | Freq: Every day | 12 refills | Status: DC

## 2024-04-03 MED ORDER — ONETOUCH ULTRASOFT LANCETS MISC: 12 refills | Status: AC

## 2024-04-03 MED ORDER — ONETOUCH VERIO VI STRP: 1.0000 | ORAL_STRIP | Freq: Every day | 12 refills | Status: AC

## 2024-04-03 MED ORDER — ONETOUCH ULTRASOFT LANCETS MISC: 12 refills | Status: DC

## 2024-04-03 NOTE — Addendum Note (Signed)
 Addended by: ARLOA JEOFFREY SAILOR on: 04/03/2024 09:38 AM   Modules accepted: Orders

## 2024-04-03 NOTE — Telephone Encounter (Signed)
 OV 09/18/23 Depo injection 09/15/23 AEX 12/07/22

## 2024-04-04 ENCOUNTER — Other Ambulatory Visit: Payer: Self-pay | Admitting: Nurse Practitioner

## 2024-04-04 ENCOUNTER — Encounter: Payer: Self-pay | Admitting: Internal Medicine

## 2024-04-04 DIAGNOSIS — N939 Abnormal uterine and vaginal bleeding, unspecified: Secondary | ICD-10-CM

## 2024-04-04 DIAGNOSIS — Z30011 Encounter for initial prescription of contraceptive pills: Secondary | ICD-10-CM

## 2024-04-04 MED ORDER — NORETHIN ACE-ETH ESTRAD-FE 1-20 MG-MCG PO TABS: 1.0000 | ORAL_TABLET | Freq: Every day | ORAL | 0 refills | Status: AC

## 2024-04-04 MED ORDER — MEGESTROL ACETATE 40 MG PO TABS: 40.0000 mg | ORAL_TABLET | Freq: Every day | ORAL | 0 refills | Status: AC

## 2024-05-29 ENCOUNTER — Ambulatory Visit: Admitting: Internal Medicine
